# Patient Record
Sex: Female | Born: 1990 | Race: White | Hispanic: Yes | Smoking: Never smoker
Health system: Southern US, Community
[De-identification: ages and names within clinical notes are randomized; demographics above are authoritative.]

## PROBLEM LIST (undated history)

## (undated) ENCOUNTER — Inpatient Hospital Stay (HOSPITAL_COMMUNITY): Payer: Self-pay

## (undated) DIAGNOSIS — Z789 Other specified health status: Secondary | ICD-10-CM

## (undated) DIAGNOSIS — K8020 Calculus of gallbladder without cholecystitis without obstruction: Secondary | ICD-10-CM

## (undated) HISTORY — PX: NO PAST SURGERIES: SHX2092

## (undated) HISTORY — PX: OTHER SURGICAL HISTORY: SHX169

---
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
## 2012-01-25 ENCOUNTER — Encounter (HOSPITAL_COMMUNITY): Payer: Self-pay | Admitting: *Deleted

## 2012-01-25 ENCOUNTER — Inpatient Hospital Stay (HOSPITAL_COMMUNITY)
Admission: AD | Admit: 2012-01-25 | Discharge: 2012-01-25 | Disposition: A | Discharge status: Home / Self Care | Payer: Medicaid Other | Source: Ambulatory Visit | Attending: Obstetrics | Admitting: Obstetrics

## 2012-01-25 DIAGNOSIS — R109 Unspecified abdominal pain: Secondary | ICD-10-CM | POA: Insufficient documentation

## 2012-01-25 DIAGNOSIS — K5900 Constipation, unspecified: Secondary | ICD-10-CM | POA: Insufficient documentation

## 2012-01-25 LAB — WET PREP, GENITAL
Clue Cells Wet Prep HPF POC: NONE SEEN
Trich, Wet Prep: NONE SEEN

## 2012-01-25 LAB — URINALYSIS, ROUTINE W REFLEX MICROSCOPIC
Bilirubin Urine: NEGATIVE
Protein, ur: NEGATIVE mg/dL
Urobilinogen, UA: 0.2 mg/dL (ref 0.0–1.0)

## 2012-01-25 LAB — URINE MICROSCOPIC-ADD ON

## 2012-01-25 MED ORDER — CALCIUM POLYCARBOPHIL 625 MG PO TABS: 625.0000 mg | ORAL_TABLET | Freq: Every day | ORAL | Status: DC

## 2012-01-25 MED ORDER — POLYETHYLENE GLYCOL 3350 17 G PO PACK: 17.0000 g | PACK | Freq: Every day | ORAL | Status: AC | PRN

## 2012-01-25 NOTE — Discharge Instructions (Signed)
Constipacin en los adultos  (Constipation in Adults)  Se denomina constipacin al hecho de mover el intestino menos de dos veces por semana. Generalmente las heces son duras. A medida que envejecemos, la constipacin es ms frecuente. Si trata de solucionarlo con laxantes, puede empeorar el problema. Los laxantes utilizados durante largos perodos pueden debilitar los msculos del colon. Esto har que la constipacin empeore gradualmente. Una dieta pobre en fibras, la ingesta insuficiente de lquidos y algunos medicamentos pueden empeorar el problema.  ALGUNOS MEDICAMENTOS QUE PUEDEN CAUSAR CONSTIPACIN SON:   Diurticos.   Boqueadotes de los canales de calcio (medicamento utilizado para controlar la presin arterial y el funcionamiento cardaco.   Narcticos (ciertos medicamentos para el dolor).   Anticolinrgicos.   Antiinflamatorios.   Anticidos que contengan aluminio.  ALGUNOS MEDICAMENTOS QUE PUEDEN CAUSAR CONSTIPACIN SON:    Diabetes.   Enfermedad de Parkinson.   Demencia (la mente no funciona adecuadamente y existen trastornos del pensamiento).   Ictus.   Depresin.   Otras enfermedades que causan dificultades con el metabolismo de sales y lquidos.  INSTRUCCIONES PARA EL CUIDADO DOMICILIARIO   El mejor tratamiento para la constipacin es el que se realiza sin tomar medicamentos. Es importante consumir ms fibras, frutas y vegetales.   Aumente lentamente el consumo de fibras a 25 a 38 gramos por da. Granos integrales, frutas, verduras y legumbres son buenas fuentes de fibra. Un dietista podr ayudarlo a incorporar alimentos altos en fibra en su dieta.   Beba gran cantidad de lquido para mantener la orina de tono claro o color amarillo plido.   Deber aadir un suplemento de fibra en su dieta si no puede recibir la cantidad suficiente a partir de los alimentos.   Aumentar la actividad fsica tambin ayuda a mejorar la regularidad.   Los supositorios, segn lo haya indicado el mdico,  podrn ser de utilidad. Si toma anticidos u otros productos que contengan aluminio o calcio, los que pueden causar constipacin, ser de gran ayuda cambiarlos por productos que contengan magnesio, con la aprobacin del mdico.   Si en el da de hoy le han aplicado un enema, considere que esta slo es una medida temporaria y no debe considerarse como tratamiento para la constipacin de larga data (crnica). Si utiliza enemas durante mucho tiempo, se debilitarn los msculos del colon y la constipacin empeorar.   Tambin deben evitarse en lo posible medidas ms enrgicas, como el consumo de sulfato de magnesio, siempre que sea posible. El sulfato de magnesio puede causar diarrea incontrolable. Sin embargo, si usted es una persona de edad avanzada, estas medidas pueden ser tentadoras. En algunos casos, este tipo de medidas ni siquiera le dan tiempo para llegar al bao.  SOLICITE ATENCIN MDICA DE INMEDIATO SI:   Observa sangre de color rojo brillante en las heces.   El estreimiento persiste durante ms de cuatro das.   Presenta dolor abdominal o rectal junto con el estreimiento.   No parece sentirse mejor.   Tiene preguntas para formular, o alguna preocupacin, o no mejora.  EST SEGURO QUE:    Comprende las instrucciones para el alta mdica.   Controlar su enfermedad.   Solicitar atencin mdica de inmediato segn las indicaciones.  Document Released: 08/09/2007 Document Revised: 07/09/2011  [employer] Patient Information 2012 [employer].

## 2012-01-25 NOTE — MAU Note (Signed)
Pt states, " I had my baby Feb. 14,th and I missed my six week appointment. The office told me to come here to checked. I sometimes have pain in my abdomen, but none today."

## 2012-01-25 NOTE — MAU Provider Note (Signed)
History     CSN: [id number]  Arrival date and time: 01/25/12 7829   First Provider Initiated Contact with Patient 01/25/12 2126      Chief Complaint  Patient presents with  . Abdominal Cramping   HPI This is a 21 y.o. female who presents with c/o diffuse, irregular abdominal pain since February when she had a baby.  Has tried Advil 400mg  one time only. Has a long history of constipation (goes every 2 weeks).  Never went back for her 6 week PP visit. Has not gotten any contraception and does not want to be pregnant.   RN NOTE: Pt states, " I had my baby Feb. 14,th and I missed my six week appointment. The office told me to come here to checked. I sometimes have pain in my abdomen, but none today."       OB History    Grav Para Term Preterm Abortions TAB SAB Ect Mult Living   1 1 1  0 0 0 0 0 0 1      Past Medical History  Diagnosis Date  . No pertinent past medical history     Past Surgical History  Procedure Date  . No past surgeries     Family History  Problem Relation Age of Onset  . Heart disease Mother   . Diabetes Mother   . Hyperlipidemia Mother   . Anesthesia problems Neg Hx   . Hypotension Neg Hx   . Malignant hyperthermia Neg Hx   . Pseudochol deficiency Neg Hx   . Mental retardation Father     downs syndrome    History  Substance Use Topics  . Smoking status: Never Smoker   . Smokeless tobacco: Never Used  . Alcohol Use: No    Allergies: No Known Allergies  Prescriptions prior to admission  Medication Sig Dispense Refill  . ibuprofen (ADVIL,MOTRIN) 600 MG tablet Take 1 tablet (600 mg total) by mouth every 6 (six) hours as needed for pain (pain scale < 4).  30 tablet  5    Review of Systems  Constitutional: Negative for fever and chills.  Gastrointestinal: Positive for abdominal pain and constipation. Negative for nausea, vomiting and diarrhea.   Physical Exam   Blood pressure 124/71, pulse 74, temperature 99 F (37.2 C), temperature  source Oral, resp. rate 16, height 5' 3.75" (1.619 m), weight 208 lb 9 oz (94.603 kg), last menstrual period 01/21/2012, not currently breastfeeding.  Physical Exam  Constitutional: She is oriented to person, place, and time. She appears well-developed and well-nourished. No distress.  HENT:  Head: Normocephalic.  Cardiovascular: Normal rate.   Respiratory: Effort normal.  GI: Soft. Bowel sounds are normal. She exhibits no distension and no mass. There is no tenderness. There is no rebound and no guarding.  Genitourinary: No vaginal discharge found.  Musculoskeletal: Normal range of motion.  Neurological: She is alert and oriented to person, place, and time.  Skin: Skin is warm and dry.  Psychiatric: She has a normal mood and affect.   CBC    Component Value Date/Time   WBC 11.6* 09/18/2011 0605   RBC 2.71* 09/18/2011 0605   HGB 6.7* 09/18/2011 0605   HCT 21.5* 09/18/2011 0605   PLT 337 09/18/2011 0605   MCV 79.3 09/18/2011 0605   MCH 24.7* 09/18/2011 0605   MCHC 31.2 09/18/2011 0605   RDW 17.5* 09/18/2011 5621     MAU Course  Procedures  MDM Discussed reason for PP exam with pt and husband. Discussed  constipation and its association with colicky pain. Discussed fiber therapy for prevention and Miralax for treatment. Discussed anemia she had on discharge in February and need to continue iron/vitamins until rechecked. Discussed need to find a family doctor (husband has one).    Assessment and Plan  A:  Constipation       4 months Postpartum P:  Discharge home       Use Miralax for constipation and daily fiber for prevention       Good hydration       Discussed need for contraception if she does not want to get pregnant, can go to Dr Gaynell Face or [facility] (only had Emergency Medicaid).  [name] 01/25/2012, 10:05 PM

## 2012-01-25 NOTE — MAU Note (Signed)
Pt delivered vaginally on 09/16/10 and did not have a 6 week postpartum check-up; she is c/o period cramps all the time since her delivery;

## 2012-01-26 LAB — GC/CHLAMYDIA PROBE AMP, GENITAL
Chlamydia, DNA Probe: NEGATIVE
GC Probe Amp, Genital: NEGATIVE

## 2012-08-28 ENCOUNTER — Inpatient Hospital Stay (HOSPITAL_COMMUNITY)
Admission: AD | Admit: 2012-08-28 | Discharge: 2012-08-28 | Disposition: A | Discharge status: Home / Self Care | Payer: Self-pay | Source: Ambulatory Visit | Attending: Obstetrics & Gynecology | Admitting: Obstetrics & Gynecology

## 2012-08-28 ENCOUNTER — Encounter (HOSPITAL_COMMUNITY): Payer: Self-pay | Admitting: Family

## 2012-08-28 DIAGNOSIS — R109 Unspecified abdominal pain: Secondary | ICD-10-CM | POA: Insufficient documentation

## 2012-08-28 DIAGNOSIS — Z3202 Encounter for pregnancy test, result negative: Secondary | ICD-10-CM | POA: Insufficient documentation

## 2012-08-28 DIAGNOSIS — N926 Irregular menstruation, unspecified: Secondary | ICD-10-CM | POA: Insufficient documentation

## 2012-08-28 DIAGNOSIS — N912 Amenorrhea, unspecified: Secondary | ICD-10-CM | POA: Insufficient documentation

## 2012-08-28 LAB — URINALYSIS, ROUTINE W REFLEX MICROSCOPIC
Glucose, UA: NEGATIVE mg/dL
Hgb urine dipstick: NEGATIVE
Leukocytes, UA: NEGATIVE
Specific Gravity, Urine: 1.02 (ref 1.005–1.030)
Urobilinogen, UA: 0.2 mg/dL (ref 0.0–1.0)

## 2012-08-28 LAB — WET PREP, GENITAL: Yeast Wet Prep HPF POC: NONE SEEN

## 2012-08-28 LAB — POCT PREGNANCY, URINE: Preg Test, Ur: NEGATIVE

## 2012-08-28 NOTE — MAU Provider Note (Signed)
Attestation of Attending Supervision of Advanced Practitioner (CNM/NP): Evaluation and management procedures were performed by the Advanced Practitioner under my supervision and collaboration.  I have reviewed the Advanced Practitioner's note and chart, and I agree with the management and plan.  HARRAWAY-SMITH, Jessiah Steinhart 9:25 PM

## 2012-08-28 NOTE — MAU Note (Signed)
Pt presents to MAU with complaints of no period for 2-3 months. Says she is usually very regular every month. She has taken several pregnancy tests and all were negative. She denies pain.

## 2012-08-28 NOTE — MAU Provider Note (Signed)
History     CSN: [id number]  Arrival date and time: 08/28/12 4098   First Provider Initiated Contact with Patient 08/28/12 1927      Chief Complaint  Patient presents with  . Amenorrhea   HPI Victoria Orozco is 22 y.o. G1P1001 presents with no period since 12/22 with abdominal cramping.   Denies vaginal bleeding or discharge.  Rates the pain as 5/10.  Advil helps the discomfort.   She had a negative UPT at home and the one here is also Neg.  She is trying to conceive  Had regular cycles prior to conceiving the first time.  He will be 1 next month.   Has not been on any contraception.  She reports only 1 period since delivery.  She did not breast feed.  Patient has seen Dr. Gaynell Face in the past and prefers to return to his office for further evaluation.     Past Medical History  Diagnosis Date  . No pertinent past medical history     Past Surgical History  Procedure Date  . No past surgeries     Family History  Problem Relation Age of Onset  . Heart disease Mother   . Diabetes Mother   . Hyperlipidemia Mother   . Anesthesia problems Neg Hx   . Hypotension Neg Hx   . Malignant hyperthermia Neg Hx   . Pseudochol deficiency Neg Hx   . Mental retardation Father     downs syndrome    History  Substance Use Topics  . Smoking status: Never Smoker   . Smokeless tobacco: Never Used  . Alcohol Use: No    Allergies: No Known Allergies  Prescriptions prior to admission  Medication Sig Dispense Refill  . ibuprofen (ADVIL,MOTRIN) 600 MG tablet Take 1 tablet (600 mg total) by mouth every 6 (six) hours as needed for pain (pain scale < 4).  30 tablet  5  . polycarbophil (FIBERCON) 625 MG tablet Take 1 tablet (625 mg total) by mouth daily.  30 tablet  1    Review of Systems  Constitutional: Negative.   Respiratory: Negative.   Cardiovascular: Negative.   Gastrointestinal: Positive for abdominal pain (cramping).  Genitourinary: Negative for dysuria, urgency and  frequency.       Neg for vaginal bleeding or discharge.  Missed period  Neurological: Negative for headaches.   Physical Exam   Blood pressure 134/71, pulse 79, temperature 97.8 F (36.6 C), temperature source Oral, resp. rate 18, height 5\' 5"  (1.651 m), weight 218 lb (98.884 kg), last menstrual period 07/24/2012, not currently breastfeeding.  Physical Exam  Constitutional: She is oriented to person, place, and time. She appears well-developed and well-nourished. No distress.  HENT:  Head: Normocephalic.  Neck: Normal range of motion.  Cardiovascular: Normal rate.   Respiratory: Effort normal.  GI: Soft. She exhibits no distension and no mass. There is no tenderness. There is no rebound and no guarding.  Genitourinary: Uterus is not enlarged and not tender. Cervix exhibits no motion tenderness, no discharge and no friability. Right adnexum displays no mass, no tenderness and no fullness. Left adnexum displays no mass, no tenderness and no fullness. No erythema, tenderness or bleeding around the vagina. Vaginal discharge (small amount of malodorous discharge--white.  ) found.  Neurological: She is alert and oriented to person, place, and time.  Skin: Skin is warm and dry.  Psychiatric: She has a normal mood and affect. Her behavior is normal.   Results for orders placed during the hospital  encounter of 08/28/12 (from the past 24 hour(s))  URINALYSIS, ROUTINE W REFLEX MICROSCOPIC     Status: Normal   Collection Time   08/28/12  6:50 PM      Component Value Range   Color, Urine YELLOW  YELLOW   APPearance CLEAR  CLEAR   Specific Gravity, Urine 1.020  1.005 - 1.030   pH 6.0  5.0 - 8.0   Glucose, UA NEGATIVE  NEGATIVE mg/dL   Hgb urine dipstick NEGATIVE  NEGATIVE   Bilirubin Urine NEGATIVE  NEGATIVE   Ketones, ur NEGATIVE  NEGATIVE mg/dL   Protein, ur NEGATIVE  NEGATIVE mg/dL   Urobilinogen, UA 0.2  0.0 - 1.0 mg/dL   Nitrite NEGATIVE  NEGATIVE   Leukocytes, UA NEGATIVE  NEGATIVE    POCT PREGNANCY, URINE     Status: Normal   Collection Time   08/28/12  6:58 PM      Component Value Range   Preg Test, Ur NEGATIVE  NEGATIVE  WET PREP, GENITAL     Status: Abnormal   Collection Time   08/28/12  7:44 PM      Component Value Range   Yeast Wet Prep HPF POC NONE SEEN  NONE SEEN   Trich, Wet Prep NONE SEEN  NONE SEEN   Clue Cells Wet Prep HPF POC NONE SEEN  NONE SEEN   WBC, Wet Prep HPF POC FEW (*) NONE SEEN   MAU Course  Procedures  GC/CHL culture to lab  MDM  Assessment and Plan  A:  Irregular menses     Trying to conceive      Negative pregnancy test  P:  Patient wants to return to Dr. Gaynell Face for further evaluation of irregular cycles      She wants to conceive  KEY,EVE M 08/28/2012, 7:27 PM

## 2012-08-28 NOTE — MAU Note (Addendum)
Patient presents to MAU with c/o no period since 12/22. Reports intermittent abdominal cramping; reports she is not on [facility] and is trying to conceive. Negative HPT.

## 2012-09-11 ENCOUNTER — Emergency Department (HOSPITAL_COMMUNITY)
Admission: EM | Admit: 2012-09-11 | Discharge: 2012-09-12 | Disposition: A | Discharge status: Home / Self Care | Payer: Self-pay | Attending: Emergency Medicine | Admitting: Emergency Medicine

## 2012-09-11 ENCOUNTER — Encounter (HOSPITAL_COMMUNITY): Payer: Self-pay | Admitting: *Deleted

## 2012-09-11 DIAGNOSIS — D649 Anemia, unspecified: Secondary | ICD-10-CM | POA: Insufficient documentation

## 2012-09-11 DIAGNOSIS — R42 Dizziness and giddiness: Secondary | ICD-10-CM | POA: Insufficient documentation

## 2012-09-11 DIAGNOSIS — Z3202 Encounter for pregnancy test, result negative: Secondary | ICD-10-CM | POA: Insufficient documentation

## 2012-09-11 DIAGNOSIS — N920 Excessive and frequent menstruation with regular cycle: Secondary | ICD-10-CM | POA: Insufficient documentation

## 2012-09-11 NOTE — ED Notes (Signed)
Pt states her LMP started 3 weeks ago and she is still bleeding. Wants to get checked out

## 2012-09-12 LAB — URINALYSIS, ROUTINE W REFLEX MICROSCOPIC
Glucose, UA: NEGATIVE mg/dL
Protein, ur: NEGATIVE mg/dL
pH: 6 (ref 5.0–8.0)

## 2012-09-12 LAB — POCT I-STAT, CHEM 8
BUN: 13 mg/dL (ref 6–23)
Calcium, Ion: 1.26 mmol/L — ABNORMAL HIGH (ref 1.12–1.23)
Chloride: 107 mEq/L (ref 96–112)
Glucose, Bld: 98 mg/dL (ref 70–99)
TCO2: 26 mmol/L (ref 0–100)

## 2012-09-12 LAB — WET PREP, GENITAL: Yeast Wet Prep HPF POC: NONE SEEN

## 2012-09-12 LAB — URINE MICROSCOPIC-ADD ON

## 2012-09-12 LAB — POCT PREGNANCY, URINE: Preg Test, Ur: NEGATIVE

## 2012-09-12 MED ORDER — FERROUS SULFATE 325 (65 FE) MG PO TABS: 325.0000 mg | ORAL_TABLET | Freq: Every day | ORAL | Status: DC

## 2012-09-12 NOTE — ED Provider Notes (Signed)
History     CSN: [id number]  Arrival date & time 09/11/12  2129   First MD Initiated Contact with Patient 09/11/12 2340      Chief Complaint  Patient presents with  . Vaginal Bleeding    (Consider location/radiation/quality/duration/timing/severity/associated sxs/prior treatment) HPI Cerria Randhawa is a 22 y.o. female With a history of menorrhagia as well as anemia who presents with continued bleeding since her last menstrual period started 3 weeks ago with continued bleeding. She has minimal cramping, and is pain-free at this time. She specifically denies any nausea vomiting, diarrhea, abdominal pain, chest pain, dizziness or lightheadedness.  She says she is occasionally lightheaded at rest this lasts about 5 minutes, she denies any history of venous thromboembolism in her or family members, no recent long travel, no hemoptysis, no cancer treatment, no exogenous hormones.   Past Medical History  Diagnosis Date  . No pertinent past medical history     Past Surgical History  Procedure Laterality Date  . No past surgeries      Family History  Problem Relation Age of Onset  . Heart disease Mother   . Diabetes Mother   . Hyperlipidemia Mother   . Anesthesia problems Neg Hx   . Hypotension Neg Hx   . Malignant hyperthermia Neg Hx   . Pseudochol deficiency Neg Hx   . Mental retardation Father     downs syndrome    History  Substance Use Topics  . Smoking status: Never Smoker   . Smokeless tobacco: Never Used  . Alcohol Use: No    OB History   Grav Para Term Preterm Abortions TAB SAB Ect Mult Living   1 1 1  0 0 0 0 0 0 1      Review of Systems At least 10pt or greater review of systems completed and are negative except where specified in the HPI.  Allergies  Review of patient's allergies indicates no known allergies.  Home Medications  No current outpatient prescriptions on file.  BP 148/84  Pulse 82  Temp(Src) 98.4 F (36.9 C) (Oral)  SpO2 99%   LMP 07/24/2012  Physical Exam  Nursing notes reviewed.  Electronic medical record reviewed. VITAL SIGNS:   Filed Vitals:   09/11/12 2134  BP: 148/84  Pulse: 82  Temp: 98.4 F (36.9 C)  TempSrc: Oral  SpO2: 99%   CONSTITUTIONAL: Awake, oriented, appears non-toxic HENT: Atraumatic, normocephalic, oral mucosa pink and moist, airway patent. Nares patent without drainage. External ears normal. EYES: Conjunctiva clear, EOMI, PERRLA NECK: Trachea midline, non-tender, supple CARDIOVASCULAR: Normal heart rate, Normal rhythm, No murmurs, rubs, gallops PULMONARY/CHEST: Clear to auscultation, no rhonchi, wheezes, or rales. Symmetrical breath sounds. Non-tender. ABDOMINAL: Non-distended, soft, non-tender - no rebound or guarding.  BS normal. NEUROLOGIC: Non-focal, moving all four extremities, no gross sensory or motor deficits. EXTREMITIES: No clubbing, cyanosis, or edema SKIN: Warm, Dry, No erythema, No rash PELVIC EXAM: normal external genitalia, vulva, dark blood in the vaginal vault, cervix appears normal without cervical motion tenderness, uterus and adnexa are difficult to palpate secondary to patient body habitus ED Course  Procedures (including critical care time)  Labs Reviewed  WET PREP, GENITAL - Abnormal; Notable for the following:    WBC, Wet Prep HPF POC FEW (*)    All other components within normal limits  URINALYSIS, ROUTINE W REFLEX MICROSCOPIC - Abnormal; Notable for the following:    APPearance CLOUDY (*)    Hgb urine dipstick LARGE (*)    All other components  within normal limits  POCT I-STAT, CHEM 8 - Abnormal; Notable for the following:    Calcium, Ion 1.26 (*)    Hemoglobin 11.6 (*)    HCT 34.0 (*)    All other components within normal limits  GC/CHLAMYDIA PROBE AMP  URINE MICROSCOPIC-ADD ON  POCT PREGNANCY, URINE   No results found.   1. Menorrhagia   2. Anemia       MDM  Girtha Kilgore is a 22 y.o. female Presents with occasional shortness  of breath and concerns over prolonged vaginal bleeding, patient is not pregnant, she is mildly anemic with a hemoglobin of 11.6 and is currently not taking any iron supplements. She is currently pain-free. I do not think her shortness of breath is related to ACS, this is a young otherwise healthy female although she is obese, in addition she has a pertinent negative score, do not think this intermittent shortness of breath is caused by a blood clot.  Based on the transient nature, I also don't think it's related to her borderline anemia. I do think it is worth starting the patient on iron supplements given the patient's history of severe anemia and menorrhagia. We'll refer the patient to [facility] or the [facility] and give her a resource guide for primary care physician as she will likely benefit from oral contraceptives to regulate her menstrual cycle.  I explained the diagnosis and have given explicit precautions to return to the ER including Dizziness, chest pain, shortness of breath that is constant, severe bleeding or any other new or worsening symptoms. The patient understands and accepts the medical plan as it's been dictated and I have answered their questions. Discharge instructions concerning home care and prescriptions have been given.  The patient is STABLE and is discharged to home in good condition.         Jones Skene, MD 09/12/12 1610

## 2012-09-13 LAB — GC/CHLAMYDIA PROBE AMP
CT Probe RNA: NEGATIVE
GC Probe RNA: NEGATIVE

## 2012-12-31 ENCOUNTER — Encounter (HOSPITAL_COMMUNITY): Payer: Self-pay | Admitting: Obstetrics and Gynecology

## 2012-12-31 ENCOUNTER — Inpatient Hospital Stay (HOSPITAL_COMMUNITY)
Admission: AD | Admit: 2012-12-31 | Discharge: 2012-12-31 | Disposition: A | Discharge status: Home / Self Care | Payer: Self-pay | Source: Ambulatory Visit | Attending: Obstetrics & Gynecology | Admitting: Obstetrics & Gynecology

## 2012-12-31 DIAGNOSIS — Z3202 Encounter for pregnancy test, result negative: Secondary | ICD-10-CM | POA: Insufficient documentation

## 2012-12-31 DIAGNOSIS — Z3201 Encounter for pregnancy test, result positive: Secondary | ICD-10-CM

## 2012-12-31 DIAGNOSIS — N912 Amenorrhea, unspecified: Secondary | ICD-10-CM | POA: Insufficient documentation

## 2012-12-31 MED ORDER — PRENATAL PLUS 27-1 MG PO TABS: 1.0000 | ORAL_TABLET | Freq: Every day | ORAL | Status: DC

## 2012-12-31 NOTE — MAU Note (Signed)
Pt has not had a period and took 3 pregnancy test and they where all positive wants to make sure she is pregnant.

## 2012-12-31 NOTE — MAU Provider Note (Signed)
CC: Possible Pregnancy        Subjective HPI Victoria Orozco 22 y.o. G1P1001 [redacted]w[redacted]d by LMP presents for verification of pregnancy. HPT positive. Denies vaginal bleeding, abdominal pain and nausea/vomiting. Menses were irregular and she got a pill from [facility] in [place] and when finished the course she had regular menses x2. No other concerns.   Significant PMH: None.  Significant OB history: None Nonsmoker  Objective Blood pressure 130/74, pulse 69, temperature 98.4 F (36.9 C), temperature source Oral, resp. rate 18, height 5\' 4"  (1.626 m), weight 101.969 kg (224 lb 12.8 oz), last menstrual period 11/25/2012.  Physical Exam General: WN, WD female in no apparent distress Abdomen: soft, nontender, without organomegaly  Lab Results Results for orders placed during the hospital encounter of 12/31/12 (from the past 24 hour(s))  POCT PREGNANCY, URINE     Status: None   Collection Time    12/31/12  4:10 PM      Result Value Range   Preg Test, Ur NEGATIVE  NEGATIVE    Assessment 21 y.o. G1P1001 amenorrhea x 1 wk with negative pregnancy test.  Plan Reassured. Keep menstrual calendar. Do HPT in 1 wk if still amenorrheic.    Medication List    TAKE these medications       ferrous sulfate 325 (65 FE) MG tablet  Take 1 tablet (325 mg total) by mouth daily.     prenatal vitamin w/FE, FA 27-1 MG Tabs  Take 1 tablet by mouth daily.       Follow-up Information   Follow up with [facility] [place]. (See list of providers below)    Contact information:   [address] [place] [address] [phone]       Danae Orleans, CNM 12/31/2012 4:02 PM

## 2012-12-31 NOTE — MAU Provider Note (Signed)
Attestation of Attending Supervision of Advanced Practitioner (CNM/NP): Evaluation and management procedures were performed by the Advanced Practitioner under my supervision and collaboration.  I have reviewed the Advanced Practitioner's note and chart, and I agree with the management and plan.  HARRAWAY-SMITH, Bulah Lurie 6:47 PM     

## 2012-12-31 NOTE — ED Notes (Signed)
Pregnancy test negative. D.Poe, CNM discussed results with pt and told her to retest in one week.

## 2013-05-01 ENCOUNTER — Emergency Department (HOSPITAL_COMMUNITY)
Admission: EM | Admit: 2013-05-01 | Discharge: 2013-05-02 | Disposition: A | Discharge status: Home / Self Care | Payer: Self-pay | Attending: Emergency Medicine | Admitting: Emergency Medicine

## 2013-05-01 ENCOUNTER — Encounter (HOSPITAL_COMMUNITY): Payer: Self-pay | Admitting: *Deleted

## 2013-05-01 DIAGNOSIS — N898 Other specified noninflammatory disorders of vagina: Secondary | ICD-10-CM | POA: Insufficient documentation

## 2013-05-01 DIAGNOSIS — Z3202 Encounter for pregnancy test, result negative: Secondary | ICD-10-CM | POA: Insufficient documentation

## 2013-05-01 DIAGNOSIS — N939 Abnormal uterine and vaginal bleeding, unspecified: Secondary | ICD-10-CM

## 2013-05-01 LAB — URINE MICROSCOPIC-ADD ON

## 2013-05-01 LAB — URINALYSIS, ROUTINE W REFLEX MICROSCOPIC
Bilirubin Urine: NEGATIVE
Glucose, UA: NEGATIVE mg/dL
Ketones, ur: NEGATIVE mg/dL
Nitrite: NEGATIVE
Specific Gravity, Urine: 1.021 (ref 1.005–1.030)
pH: 5.5 (ref 5.0–8.0)

## 2013-05-01 NOTE — ED Notes (Signed)
Pt states vaginal bleeding for 2 weeks at a time. Pt states that she the bleeding will stop then if she lifts something heavy such as her 22y/o she will start to bleed again. Pt states that she is changing pads much like her period. Pt states painful intercourse as well.

## 2013-05-02 LAB — WET PREP, GENITAL
Clue Cells Wet Prep HPF POC: NONE SEEN
Trich, Wet Prep: NONE SEEN
Yeast Wet Prep HPF POC: NONE SEEN

## 2013-05-02 LAB — POCT I-STAT, CHEM 8
Calcium, Ion: 1.27 mmol/L — ABNORMAL HIGH (ref 1.12–1.23)
Creatinine, Ser: 0.7 mg/dL (ref 0.50–1.10)
Glucose, Bld: 91 mg/dL (ref 70–99)
Hemoglobin: 12.2 g/dL (ref 12.0–15.0)
Potassium: 4 mEq/L (ref 3.5–5.1)

## 2013-05-02 LAB — CBC
HCT: 35.5 % — ABNORMAL LOW (ref 36.0–46.0)
Hemoglobin: 12.4 g/dL (ref 12.0–15.0)
MCHC: 34.9 g/dL (ref 30.0–36.0)
MCV: 84.5 fL (ref 78.0–100.0)

## 2013-05-02 MED ORDER — CEFTRIAXONE SODIUM 250 MG IJ SOLR
250.0000 mg | Freq: Once | INTRAMUSCULAR | Status: AC
  Administered 2013-05-02: 250 mg via INTRAMUSCULAR
  Filled 2013-05-02: qty 250

## 2013-05-02 MED ORDER — AZITHROMYCIN 1 G PO PACK
1.0000 g | PACK | Freq: Once | ORAL | Status: AC
  Administered 2013-05-02: 1 g via ORAL
  Filled 2013-05-02: qty 1

## 2013-05-02 MED ORDER — LIDOCAINE HCL (PF) 1 % IJ SOLN
INTRAMUSCULAR | Status: AC
  Filled 2013-05-02: qty 5

## 2013-05-02 NOTE — ED Provider Notes (Signed)
CSN: [id number]     Arrival date & time 05/01/13  1944 History   First MD Initiated Contact with Patient 05/01/13 2339     Chief Complaint  Patient presents with  . Vaginal Bleeding   (Consider location/radiation/quality/duration/timing/severity/associated sxs/prior Treatment) HPI Hx per PT  - LNMP 03/05/13.  She has had ongoing vaginal bleeding and some intermittent cramping, describes heavy bleeding. No back pain. No dysuria/ urgency/ frequency. No vag discharge.  Husband bedside. She denies h/o same. Symptoms MOD in severity.   Past Medical History  Diagnosis Date  . No pertinent past medical history    Past Surgical History  Procedure Laterality Date  . No past surgeries     Family History  Problem Relation Age of Onset  . Heart disease Mother   . Diabetes Mother   . Hyperlipidemia Mother   . Anesthesia problems Neg Hx   . Hypotension Neg Hx   . Malignant hyperthermia Neg Hx   . Pseudochol deficiency Neg Hx   . Mental retardation Father     downs syndrome   History  Substance Use Topics  . Smoking status: Never Smoker   . Smokeless tobacco: Never Used  . Alcohol Use: No   OB History   Grav Para Term Preterm Abortions TAB SAB Ect Mult Living   1 1 1  0 0 0 0 0 0 1     Review of Systems  Constitutional: Negative for fever and chills.  HENT: Negative for neck pain and neck stiffness.   Eyes: Negative for visual disturbance.  Respiratory: Negative for shortness of breath.   Cardiovascular: Negative for chest pain.  Gastrointestinal: Negative for abdominal pain.  Genitourinary: Positive for vaginal bleeding. Negative for dysuria.  Musculoskeletal: Negative for back pain.  Skin: Negative for rash.  Neurological: Negative for headaches.  All other systems reviewed and are negative.    Allergies  Review of patient's allergies indicates no known allergies.  Home Medications  No current outpatient prescriptions on file. BP 140/98  Pulse 69  Temp(Src) 98.1 F  (36.7 C) (Oral)  Resp 16  Ht 5\' 6"  (1.676 m)  Wt 227 lb 1 oz (102.995 kg)  BMI 36.67 kg/m2  SpO2 98% Physical Exam  Constitutional: She is oriented to person, place, and time. She appears well-developed and well-nourished.  HENT:  Head: Normocephalic and atraumatic.  Eyes: Conjunctivae and EOM are normal. Pupils are equal, round, and reactive to light.  Neck: Neck supple.  Cardiovascular: Normal rate, regular rhythm and intact distal pulses.   Pulmonary/Chest: Effort normal and breath sounds normal. No respiratory distress.  Abdominal: Soft. She exhibits no distension. There is no tenderness. There is no rebound and no guarding.  No CVAT  Genitourinary:  External GU within normal limits, vaginal full with minimal amount of dark blood, no cervical motion tenderness. No adnexal tenderness.  Musculoskeletal: Normal range of motion. She exhibits no edema.  Neurological: She is alert and oriented to person, place, and time.  Skin: Skin is warm and dry.    ED Course  Procedures (including critical care time) Labs Review Labs Reviewed  WET PREP, GENITAL - Abnormal; Notable for the following:    WBC, Wet Prep HPF POC MODERATE (*)    All other components within normal limits  URINALYSIS, ROUTINE W REFLEX MICROSCOPIC - Abnormal; Notable for the following:    APPearance CLOUDY (*)    Hgb urine dipstick LARGE (*)    Leukocytes, UA SMALL (*)    All other components  within normal limits  URINE MICROSCOPIC-ADD ON - Abnormal; Notable for the following:    Squamous Epithelial / LPF MANY (*)    All other components within normal limits  CBC - Abnormal; Notable for the following:    HCT 35.5 (*)    All other components within normal limits  POCT I-STAT, CHEM 8 - Abnormal; Notable for the following:    Calcium, Ion 1.27 (*)    All other components within normal limits  GC/CHLAMYDIA PROBE AMP  POCT PREGNANCY, URINE   No pain in the emergency department.   Plan discharge home with  followup women's outpatient clinics - provided. Patient agrees to take Motrin as needed for any intermittent cramping. Strict return precautions verbalized as understood.  Serial abdominal exams benign  MDM  Diagnosis: Abnormal vaginal bleeding  Labs, UA/UPT VS and nurses notes reviewed   Sunnie Nielsen, MD 05/02/13 [phone]

## 2013-05-02 NOTE — ED Notes (Signed)
Pelvic cart set up and at bedside. 

## 2013-05-03 LAB — GC/CHLAMYDIA PROBE AMP: GC Probe RNA: NEGATIVE

## 2013-09-11 ENCOUNTER — Inpatient Hospital Stay (HOSPITAL_COMMUNITY): Payer: Self-pay

## 2013-09-11 ENCOUNTER — Encounter (HOSPITAL_COMMUNITY): Payer: Self-pay | Admitting: *Deleted

## 2013-09-11 ENCOUNTER — Inpatient Hospital Stay (HOSPITAL_COMMUNITY)
Admission: AD | Admit: 2013-09-11 | Discharge: 2013-09-11 | Disposition: A | Discharge status: Home / Self Care | Payer: Self-pay | Source: Ambulatory Visit | Attending: Obstetrics and Gynecology | Admitting: Obstetrics and Gynecology

## 2013-09-11 DIAGNOSIS — R109 Unspecified abdominal pain: Secondary | ICD-10-CM | POA: Insufficient documentation

## 2013-09-11 DIAGNOSIS — O26851 Spotting complicating pregnancy, first trimester: Secondary | ICD-10-CM

## 2013-09-11 DIAGNOSIS — O26859 Spotting complicating pregnancy, unspecified trimester: Secondary | ICD-10-CM | POA: Insufficient documentation

## 2013-09-11 LAB — URINALYSIS, ROUTINE W REFLEX MICROSCOPIC
Bilirubin Urine: NEGATIVE
GLUCOSE, UA: NEGATIVE mg/dL
Hgb urine dipstick: NEGATIVE
Ketones, ur: NEGATIVE mg/dL
LEUKOCYTES UA: NEGATIVE
NITRITE: NEGATIVE
PROTEIN: NEGATIVE mg/dL
Specific Gravity, Urine: 1.015 (ref 1.005–1.030)
Urobilinogen, UA: 0.2 mg/dL (ref 0.0–1.0)
pH: 6 (ref 5.0–8.0)

## 2013-09-11 LAB — CBC
HCT: 32.3 % — ABNORMAL LOW (ref 36.0–46.0)
HEMOGLOBIN: 11 g/dL — AB (ref 12.0–15.0)
MCH: 28.1 pg (ref 26.0–34.0)
MCHC: 34.1 g/dL (ref 30.0–36.0)
MCV: 82.4 fL (ref 78.0–100.0)
PLATELETS: 302 10*3/uL (ref 150–400)
RBC: 3.92 MIL/uL (ref 3.87–5.11)
RDW: 15.5 % (ref 11.5–15.5)
WBC: 7.9 10*3/uL (ref 4.0–10.5)

## 2013-09-11 LAB — WET PREP, GENITAL
Clue Cells Wet Prep HPF POC: NONE SEEN
Trich, Wet Prep: NONE SEEN
YEAST WET PREP: NONE SEEN

## 2013-09-11 LAB — HCG, QUANTITATIVE, PREGNANCY: hCG, Beta Chain, Quant, S: 80258 m[IU]/mL — ABNORMAL HIGH (ref <5)

## 2013-09-11 LAB — POCT PREGNANCY, URINE: PREG TEST UR: POSITIVE — AB

## 2013-09-11 NOTE — MAU Note (Signed)
Pt unsure of LMP, +UPT at home 5 wks ago.  Pt having small amt of pinkish bleeding today with cramping.

## 2013-09-11 NOTE — MAU Provider Note (Signed)
History     CSN: [id number]  Arrival date and time: 09/11/13 [id number]   First Provider Initiated Contact with Patient 09/11/13 2004      Chief Complaint  Patient presents with  . Possible Pregnancy  . Vaginal Bleeding  . Abdominal Cramping   Possible Pregnancy  Vaginal Bleeding  Abdominal Cramping    Victoria Orozco is  23 y.o. G2P1001 at Unknown who presents today with with cramping and spotting. She states that she started having cramps this morning, and rates her pain 3/10. She has not taken anything for the pain. She also has been spotting since this morning as well. She states that she has had pink spotting only seen with wiping all day. She is planning on going [place] for [place].   Past Medical History  Diagnosis Date  . No pertinent past medical history     Past Surgical History  Procedure Laterality Date  . No past surgeries      Family History  Problem Relation Age of Onset  . Heart disease Mother   . Diabetes Mother   . Hyperlipidemia Mother   . Anesthesia problems Neg Hx   . Hypotension Neg Hx   . Malignant hyperthermia Neg Hx   . Pseudochol deficiency Neg Hx   . Mental retardation Father     downs syndrome    History  Substance Use Topics  . Smoking status: Never Smoker   . Smokeless tobacco: Never Used  . Alcohol Use: No    Allergies: No Known Allergies  No prescriptions prior to admission    Review of Systems  Genitourinary: Positive for vaginal bleeding.   Physical Exam   Blood pressure 135/75, pulse 68, temperature 98.2 F (36.8 C), temperature source Oral, resp. rate 16, height 5\' 6"  (1.676 m), weight 102.694 kg (226 lb 6.4 oz).  Physical Exam  Nursing note and vitals reviewed. Constitutional: She is oriented to person, place, and time. She appears well-developed and well-nourished. No distress.  Cardiovascular: Normal rate.   Respiratory: Effort normal.  GI: Soft. There is no tenderness.  Genitourinary:    External: no lesion Vagina: small amount of white discharge Cervix: pink, smooth, no CMT Uterus: slightly enlarged  Adnexa: NT   Neurological: She is alert and oriented to person, place, and time.  Skin: Skin is warm and dry.  Psychiatric: She has a normal mood and affect.    MAU Course  Procedures  Results for orders placed during the hospital encounter of 09/11/13 (from the past 24 hour(s))  URINALYSIS, ROUTINE W REFLEX MICROSCOPIC     Status: Abnormal   Collection Time    09/11/13  7:35 PM      Result Value Range   Color, Urine STRAW (*) YELLOW   APPearance CLEAR  CLEAR   Specific Gravity, Urine 1.015  1.005 - 1.030   pH 6.0  5.0 - 8.0   Glucose, UA NEGATIVE  NEGATIVE mg/dL   Hgb urine dipstick NEGATIVE  NEGATIVE   Bilirubin Urine NEGATIVE  NEGATIVE   Ketones, ur NEGATIVE  NEGATIVE mg/dL   Protein, ur NEGATIVE  NEGATIVE mg/dL   Urobilinogen, UA 0.2  0.0 - 1.0 mg/dL   Nitrite NEGATIVE  NEGATIVE   Leukocytes, UA NEGATIVE  NEGATIVE  POCT PREGNANCY, URINE     Status: Abnormal   Collection Time    09/11/13  7:42 PM      Result Value Range   Preg Test, Ur POSITIVE (*) NEGATIVE  WET PREP,  GENITAL     Status: Abnormal   Collection Time    09/11/13  8:10 PM      Result Value Range   Yeast Wet Prep HPF POC NONE SEEN  NONE SEEN   Trich, Wet Prep NONE SEEN  NONE SEEN   Clue Cells Wet Prep HPF POC NONE SEEN  NONE SEEN   WBC, Wet Prep HPF POC MODERATE (*) NONE SEEN  CBC     Status: Abnormal   Collection Time    09/11/13  8:25 PM      Result Value Range   WBC 7.9  4.0 - 10.5 K/uL   RBC 3.92  3.87 - 5.11 MIL/uL   Hemoglobin 11.0 (*) 12.0 - 15.0 g/dL   HCT 11.9 (*) 14.7 - 82.9 %   MCV 82.4  78.0 - 100.0 fL   MCH 28.1  26.0 - 34.0 pg   MCHC 34.1  30.0 - 36.0 g/dL   RDW 56.2  13.0 - 86.5 %   Platelets 302  150 - 400 K/uL  HCG, QUANTITATIVE, PREGNANCY     Status: Abnormal   Collection Time    09/11/13  8:25 PM      Result Value Range   hCG, Beta Chain, Quant, S [id number]  (*) <5 mIU/mL    US Ob Comp Less 14 Wks  09/11/2013   CLINICAL DATA:  Pregnant, bleeding  EXAM: OBSTETRIC <14 WK ULTRASOUND  TECHNIQUE: Transabdominal ultrasound was performed for evaluation of the gestation as well as the maternal uterus and adnexal regions.  COMPARISON:  None.  FINDINGS: Intrauterine gestational sac: Visualized/normal in shape.  Single.  Yolk sac:  Present  Embryo:  Present  Cardiac Activity: Present  Heart Rate: 169 bpm  CRL: 17.3 mm 8 w 2 d [place] EDC: April 21, 2014  Maternal uterus/adnexae: The right ovary is normal. There is a corpus luteal cyst measuring 1.8 x 1.7 x 1.7 cm in the left ovary. There is no free fluid. There is no subchorionic hemorrhage.  IMPRESSION: Single live intrauterine gestation measuring 8 weeks and 2 days without complications.   Electronically Signed   By: Sherian Rein M.D.   On: 09/11/2013 21:12    Assessment and Plan   1. Spotting complicating pregnancy in first trimester    Bleeding precautions First trimester precautions reviewed Return to MAU as needed  Start [facility] as soon as possible.   Tawnya Crook 09/11/2013, 9:18 PM

## 2013-09-11 NOTE — Discharge Instructions (Signed)
Vaginal Bleeding During Pregnancy, First Trimester °A small amount of bleeding (spotting) from the vagina is relatively common in early pregnancy. It usually stops on its own. Various things may cause bleeding or spotting in early pregnancy. Some bleeding may be related to the pregnancy, and some may not. In most cases, the bleeding is normal and is not a problem. However, bleeding can also be a sign of something serious. Be sure to tell your health care provider about any vaginal bleeding right away. °Some possible causes of vaginal bleeding during the first trimester include: °· Infection or inflammation of the cervix. °· Growths (polyps) on the cervix. °· Miscarriage or threatened miscarriage. °· Pregnancy tissue has developed outside of the uterus and in a fallopian tube (tubal pregnancy). °· Tiny cysts have developed in the uterus instead of pregnancy tissue (molar pregnancy). °HOME CARE INSTRUCTIONS  °Watch your condition for any changes. The following actions may help to lessen any discomfort you are feeling: °· Follow your health care provider's instructions for limiting your activity. If your health care provider orders bed rest, you may need to stay in bed and only get up to use the bathroom. However, your health care provider may allow you to continue light activity. °· If needed, make plans for someone to help with your regular activities and responsibilities while you are on bed rest. °· Keep track of the number of pads you use each day, how often you change pads, and how soaked (saturated) they are. Write this down. °· Do not use tampons. Do not douche. °· Do not have sexual intercourse or orgasms until approved by your health care provider. °· If you pass any tissue from your vagina, save the tissue so you can show it to your health care provider. °· Only take over-the-counter or prescription medicines as directed by your health care provider. °· Do not take aspirin because it can make you  bleed. °· Keep all follow-up appointments as directed by your health care provider. °SEEK MEDICAL CARE IF: °· You have any vaginal bleeding during any part of your pregnancy. °· You have cramps or labor pains. °SEEK IMMEDIATE MEDICAL CARE IF:  °· You have severe cramps in your back or belly (abdomen). °· You have a fever, not controlled by medicine. °· You pass large clots or tissue from your vagina. °· Your bleeding increases. °· You feel lightheaded or weak, or you have fainting episodes. °· You have chills. °· You are leaking fluid or have a gush of fluid from your vagina. °· You pass out while having a bowel movement. °MAKE SURE YOU: °· Understand these instructions. °· Will watch your condition. °· Will get help right away if you are not doing well or get worse. °Document Released: 04/29/2005 Document Revised: 05/10/2013 Document Reviewed: 03/27/2013 °ExitCare® Patient Information ©2014 [employer]. ° °

## 2013-09-12 LAB — GC/CHLAMYDIA PROBE AMP
CT Probe RNA: NEGATIVE
GC PROBE AMP APTIMA: NEGATIVE

## 2013-09-12 NOTE — MAU Provider Note (Signed)
Attestation of Attending Supervision of Advanced Practitioner (CNM/NP): Evaluation and management procedures were performed by the Advanced Practitioner under my supervision and collaboration.  I have reviewed the Advanced Practitioner's note and chart, and I agree with the management and plan.  Denson Niccoli 09/12/2013 7:34 AM

## 2013-10-14 ENCOUNTER — Encounter (HOSPITAL_COMMUNITY): Payer: Self-pay

## 2013-10-14 ENCOUNTER — Inpatient Hospital Stay (HOSPITAL_COMMUNITY)
Admission: AD | Admit: 2013-10-14 | Discharge: 2013-10-15 | Disposition: A | Discharge status: Home / Self Care | Payer: Self-pay | Source: Ambulatory Visit | Attending: Obstetrics & Gynecology | Admitting: Obstetrics & Gynecology

## 2013-10-14 DIAGNOSIS — R0602 Shortness of breath: Secondary | ICD-10-CM | POA: Insufficient documentation

## 2013-10-14 DIAGNOSIS — R109 Unspecified abdominal pain: Secondary | ICD-10-CM | POA: Insufficient documentation

## 2013-10-14 DIAGNOSIS — O9989 Other specified diseases and conditions complicating pregnancy, childbirth and the puerperium: Principal | ICD-10-CM

## 2013-10-14 DIAGNOSIS — O26899 Other specified pregnancy related conditions, unspecified trimester: Secondary | ICD-10-CM

## 2013-10-14 DIAGNOSIS — K30 Functional dyspepsia: Secondary | ICD-10-CM

## 2013-10-14 DIAGNOSIS — R1013 Epigastric pain: Secondary | ICD-10-CM

## 2013-10-14 DIAGNOSIS — O99891 Other specified diseases and conditions complicating pregnancy: Secondary | ICD-10-CM | POA: Insufficient documentation

## 2013-10-14 DIAGNOSIS — K3189 Other diseases of stomach and duodenum: Secondary | ICD-10-CM

## 2013-10-14 HISTORY — DX: Other specified health status: Z78.9

## 2013-10-14 LAB — CBC
HCT: 31.5 % — ABNORMAL LOW (ref 36.0–46.0)
Hemoglobin: 10.7 g/dL — ABNORMAL LOW (ref 12.0–15.0)
MCH: 28.8 pg (ref 26.0–34.0)
MCHC: 34 g/dL (ref 30.0–36.0)
MCV: 84.7 fL (ref 78.0–100.0)
PLATELETS: 290 10*3/uL (ref 150–400)
RBC: 3.72 MIL/uL — ABNORMAL LOW (ref 3.87–5.11)
RDW: 15.1 % (ref 11.5–15.5)
WBC: 8.6 10*3/uL (ref 4.0–10.5)

## 2013-10-14 LAB — URINALYSIS, ROUTINE W REFLEX MICROSCOPIC
Bilirubin Urine: NEGATIVE
Glucose, UA: NEGATIVE mg/dL
HGB URINE DIPSTICK: NEGATIVE
Ketones, ur: NEGATIVE mg/dL
Leukocytes, UA: NEGATIVE
NITRITE: NEGATIVE
PROTEIN: NEGATIVE mg/dL
Specific Gravity, Urine: >1.03 — ABNORMAL HIGH (ref 1.005–1.030)
Urobilinogen, UA: 0.2 mg/dL (ref 0.0–1.0)
pH: 6 (ref 5.0–8.0)

## 2013-10-14 LAB — WET PREP, GENITAL
CLUE CELLS WET PREP: NONE SEEN
Trich, Wet Prep: NONE SEEN
YEAST WET PREP: NONE SEEN

## 2013-10-14 NOTE — MAU Provider Note (Signed)
Chief Complaint: Abdominal Pain   None    SUBJECTIVE HPI: Victoria Orozco is a 23 y.o. G2P1001 at 5837w0d by 8 week sono who presents to maternity admissions reporting abdominal pain starting above the umbilicus and radiating down to her RLQ with onset 2 hours prior to arrival in MAU.  She also reports intermittent shortness of breath, unaffected by activity or rest with onset 1-2 weeks ago.  She indicates she had this same symptom in her previous pregnancy but no diagnosis was found.  She had 1 episode of diarrhea immediately prior to onset of the abdominal pain but denies other constipation or diarrhea recently.  She reports her mother and sister, with whom she lives, have had GI virus this week.  She denies LOF, vaginal bleeding, vaginal itching/burning, urinary symptoms, h/a, dizziness, n/v, or fever/chills.     Past Medical History  Diagnosis Date  . No pertinent past medical history   . Medical history non-contributory    Past Surgical History  Procedure Laterality Date  . No past surgeries     History   Social History  . Marital Status: Single    Spouse Name: N/A    Number of Children: N/A  . Years of Education: N/A   Occupational History  . Not on file.   Social History Main Topics  . Smoking status: Never Smoker   . Smokeless tobacco: Never Used  . Alcohol Use: No  . Drug Use: No  . Sexual Activity: Yes    Birth Control/ Protection: None     Comment: none in last 24 hours   Other Topics Concern  . Not on file   Social History Narrative  . No narrative on file   No current facility-administered medications on file prior to encounter.   No current outpatient prescriptions on file prior to encounter.   No Known Allergies  ROS: Pertinent items in HPI  OBJECTIVE Blood pressure 105/55, pulse 66, temperature 98.7 F (37.1 C), temperature source Oral, resp. rate 18, height 5\' 3"  (1.6 m), weight 102.626 kg (226 lb 4 oz), SpO2 99.00%.  GENERAL:  Well-developed, well-nourished female in no acute distress reclining in MAU bed HEENT: Normocephalic HEART: normal rate, rhythm, heart sounds RESP: normal effort, lung sounds clear and equal bilaterally ABDOMEN: Soft, non-tender, no rebound tenderness, no guarding EXTREMITIES: Nontender, no edema NEURO: Alert and oriented Pelvic exam: Cervix pink, visually closed, without lesion, scant white creamy discharge, vaginal walls and external genitalia normal Bimanual exam: Cervix 0/long/high, firm, anterior, neg CMT, uterus nontender, ~13 week size, adnexa without tenderness, enlargement, or mass  FHT 160 by doppler  LAB RESULTS Results for orders placed during the hospital encounter of 10/14/13 (from the past 24 hour(s))  URINALYSIS, ROUTINE W REFLEX MICROSCOPIC     Status: Abnormal   Collection Time    10/14/13  9:34 PM      Result Value Ref Range   Color, Urine YELLOW  YELLOW   APPearance CLEAR  CLEAR   Specific Gravity, Urine >1.030 (*) 1.005 - 1.030   pH 6.0  5.0 - 8.0   Glucose, UA NEGATIVE  NEGATIVE mg/dL   Hgb urine dipstick NEGATIVE  NEGATIVE   Bilirubin Urine NEGATIVE  NEGATIVE   Ketones, ur NEGATIVE  NEGATIVE mg/dL   Protein, ur NEGATIVE  NEGATIVE mg/dL   Urobilinogen, UA 0.2  0.0 - 1.0 mg/dL   Nitrite NEGATIVE  NEGATIVE   Leukocytes, UA NEGATIVE  NEGATIVE  CBC     Status: Abnormal  Collection Time    10/14/13 10:50 PM      Result Value Ref Range   WBC 8.6  4.0 - 10.5 K/uL   RBC 3.72 (*) 3.87 - 5.11 MIL/uL   Hemoglobin 10.7 (*) 12.0 - 15.0 g/dL   HCT 16.1 (*) 09.6 - 04.5 %   MCV 84.7  78.0 - 100.0 fL   MCH 28.8  26.0 - 34.0 pg   MCHC 34.0  30.0 - 36.0 g/dL   RDW 40.9  81.1 - 91.4 %   Platelets 290  150 - 400 K/uL  WET PREP, GENITAL     Status: Abnormal   Collection Time    10/14/13 11:00 PM      Result Value Ref Range   Yeast Wet Prep HPF POC NONE SEEN  NONE SEEN   Trich, Wet Prep NONE SEEN  NONE SEEN   Clue Cells Wet Prep HPF POC NONE SEEN  NONE SEEN   WBC,  Wet Prep HPF POC FEW (*) NONE SEEN    ASSESSMENT 1. Gastrointestinal discomfort   2. Abdominal pain in pregnancy     PLAN Discharge home Increase PO fluids List of safe medications in pregnancy given F/U as scheduled for prenatal care Return to MAU as needed    Medication List    Notice   You have not been prescribed any medications.     Follow-up Information   Follow up with [facility] [place] [facility]. (If symptoms worsen.  Also follow up as scheduled with prenatal provider [facility].)    Contact information:   [address] [id number] [place] [address] [phone]      Sharen Counter Certified Nurse-Midwife 10/14/2013  11:41 PM

## 2013-10-14 NOTE — Discharge Instructions (Signed)
Viral Gastroenteritis °Viral gastroenteritis is also known as stomach flu. This condition affects the stomach and intestinal tract. It can cause sudden diarrhea and vomiting. The illness typically lasts 3 to 8 days. Most people develop an immune response that eventually gets rid of the virus. While this natural response develops, the virus can make you quite ill. °CAUSES  °Many different viruses can cause gastroenteritis, such as rotavirus or noroviruses. You can catch one of these viruses by consuming contaminated food or water. You may also catch a virus by sharing utensils or other personal items with an infected person or by touching a contaminated surface. °SYMPTOMS  °The most common symptoms are diarrhea and vomiting. These problems can cause a severe loss of body fluids (dehydration) and a body salt (electrolyte) imbalance. Other symptoms may include: °· Fever. °· Headache. °· Fatigue. °· Abdominal pain. °DIAGNOSIS  °Your caregiver can usually diagnose viral gastroenteritis based on your symptoms and a physical exam. A stool sample may also be taken to test for the presence of viruses or other infections. °TREATMENT  °This illness typically goes away on its own. Treatments are aimed at rehydration. The most serious cases of viral gastroenteritis involve vomiting so severely that you are not able to keep fluids down. In these cases, fluids must be given through an intravenous line (IV). °HOME CARE INSTRUCTIONS  °· Drink enough fluids to keep your urine clear or pale yellow. Drink small amounts of fluids frequently and increase the amounts as tolerated. °· Ask your caregiver for specific rehydration instructions. °· Avoid: °· Foods high in sugar. °· Alcohol. °· Carbonated drinks. °· Tobacco. °· Juice. °· Caffeine drinks. °· Extremely hot or cold fluids. °· Fatty, greasy foods. °· Too much intake of anything at one time. °· Dairy products until 24 to 48 hours after diarrhea stops. °· You may consume probiotics.  Probiotics are active cultures of beneficial bacteria. They may lessen the amount and number of diarrheal stools in adults. Probiotics can be found in yogurt with active cultures and in supplements. °· Wash your hands well to avoid spreading the virus. °· Only take over-the-counter or prescription medicines for pain, discomfort, or fever as directed by your caregiver. Do not give aspirin to children. Antidiarrheal medicines are not recommended. °· Ask your caregiver if you should continue to take your regular prescribed and over-the-counter medicines. °· Keep all follow-up appointments as directed by your caregiver. °SEEK IMMEDIATE MEDICAL CARE IF:  °· You are unable to keep fluids down. °· You do not urinate at least once every 6 to 8 hours. °· You develop shortness of breath. °· You notice blood in your stool or vomit. This may look like coffee grounds. °· You have abdominal pain that increases or is concentrated in one small area (localized). °· You have persistent vomiting or diarrhea. °· You have a fever. °· The patient is a child younger than 3 months, and he or she has a fever. °· The patient is a child older than 3 months, and he or she has a fever and persistent symptoms. °· The patient is a child older than 3 months, and he or she has a fever and symptoms suddenly get worse. °· The patient is a baby, and he or she has no tears when crying. °MAKE SURE YOU:  °· Understand these instructions. °· Will watch your condition. °· Will get help right away if you are not doing well or get worse. °Document Released: 07/20/2005 Document Revised: 10/12/2011 Document Reviewed: 05/06/2011 °  ExitCare Patient Information 2014 [place], [place].  Second Trimester of Pregnancy The second trimester is from week 13 through week 28, months 4 through 6. The second trimester is often a time when you feel your best. Your body has also adjusted to being pregnant, and you begin to feel better physically. Usually, morning sickness  has lessened or quit completely, you may have more energy, and you may have an increase in appetite. The second trimester is also a time when the fetus is growing rapidly. At the end of the sixth month, the fetus is about 9 inches long and weighs about 1 pounds. You will likely begin to feel the baby move (quickening) between 18 and 20 weeks of the pregnancy. BODY CHANGES Your body goes through many changes during pregnancy. The changes vary from woman to woman.   Your weight will continue to increase. You will notice your lower abdomen bulging out.  You may begin to get stretch marks on your hips, abdomen, and breasts.  You may develop headaches that can be relieved by medicines approved by your caregiver.  You may urinate more often because the fetus is pressing on your bladder.  You may develop or continue to have heartburn as a result of your pregnancy.  You may develop constipation because certain hormones are causing the muscles that push waste through your intestines to slow down.  You may develop hemorrhoids or swollen, bulging veins (varicose veins).  You may have back pain because of the weight gain and pregnancy hormones relaxing your joints between the bones in your pelvis and as a result of a shift in weight and the muscles that support your balance.  Your breasts will continue to grow and be tender.  Your gums may bleed and may be sensitive to brushing and flossing.  Dark spots or blotches (chloasma, mask of pregnancy) may develop on your face. This will likely fade after the baby is born.  A dark line from your belly button to the pubic area (linea nigra) may appear. This will likely fade after the baby is born. WHAT TO EXPECT AT YOUR PRENATAL VISITS During a routine prenatal visit:  You will be weighed to make sure you and the fetus are growing normally.  Your blood pressure will be taken.  Your abdomen will be measured to track your baby's growth.  The fetal  heartbeat will be listened to.  Any test results from the previous visit will be discussed. Your caregiver may ask you:  How you are feeling.  If you are feeling the baby move.  If you have had any abnormal symptoms, such as leaking fluid, bleeding, severe headaches, or abdominal cramping.  If you have any questions. Other tests that may be performed during your second trimester include:  Blood tests that check for:  Low iron levels (anemia).  Gestational diabetes (between 24 and 28 weeks).  Rh antibodies.  Urine tests to check for infections, diabetes, or protein in the urine.  An ultrasound to confirm the proper growth and development of the baby.  An amniocentesis to check for possible genetic problems.  Fetal screens for spina bifida and Down syndrome. HOME CARE INSTRUCTIONS   Avoid all smoking, herbs, alcohol, and unprescribed drugs. These chemicals affect the formation and growth of the baby.  Follow your caregiver's instructions regarding medicine use. There are medicines that are either safe or unsafe to take during pregnancy.  Exercise only as directed by your caregiver. Experiencing uterine cramps is a good sign  to stop exercising.  Continue to eat regular, healthy meals.  Wear a good support bra for breast tenderness.  Do not use hot tubs, steam rooms, or saunas.  Wear your seat belt at all times when driving.  Avoid raw meat, uncooked cheese, cat litter boxes, and soil used by cats. These carry germs that can cause birth defects in the baby.  Take your prenatal vitamins.  Try taking a stool softener (if your caregiver approves) if you develop constipation. Eat more high-fiber foods, such as fresh vegetables or fruit and whole grains. Drink plenty of fluids to keep your urine clear or pale yellow.  Take warm sitz baths to soothe any pain or discomfort caused by hemorrhoids. Use hemorrhoid cream if your caregiver approves.  If you develop varicose veins,  wear support hose. Elevate your feet for 15 minutes, 3 4 times a day. Limit salt in your diet.  Avoid heavy lifting, wear low heel shoes, and practice good posture.  Rest with your legs elevated if you have leg cramps or low back pain.  Visit your dentist if you have not gone yet during your pregnancy. Use a soft toothbrush to brush your teeth and be gentle when you floss.  A sexual relationship may be continued unless your caregiver directs you otherwise.  Continue to go to all your prenatal visits as directed by your caregiver. SEEK MEDICAL CARE IF:   You have dizziness.  You have mild pelvic cramps, pelvic pressure, or nagging pain in the abdominal area.  You have persistent nausea, vomiting, or diarrhea.  You have a bad smelling vaginal discharge.  You have pain with urination. SEEK IMMEDIATE MEDICAL CARE IF:   You have a fever.  You are leaking fluid from your vagina.  You have spotting or bleeding from your vagina.  You have severe abdominal cramping or pain.  You have rapid weight gain or loss.  You have shortness of breath with chest pain.  You notice sudden or extreme swelling of your face, hands, ankles, feet, or legs.  You have not felt your baby move in over an hour.  You have severe headaches that do not go away with medicine.  You have vision changes. Document Released: 07/14/2001 Document Revised: 03/22/2013 Document Reviewed: 09/20/2012 [facility] Patient Information 2014 [place], [place].

## 2013-10-14 NOTE — MAU Note (Addendum)
Positive HPT a week ago because her mother wanted to make sure.  Pt states she has been here before and thinks she is due in September.  Denies bleeding.  Having pain on lower abd to right lower abd.  States today it was hard to catch her breath.  [place] [facility]  First OB appt pt says.

## 2013-10-15 NOTE — MAU Provider Note (Signed)
Attestation of Attending Supervision of Advanced Practitioner (CNM/NP): Evaluation and management procedures were performed by the Advanced Practitioner under my supervision and collaboration. I have reviewed the Advanced Practitioner's note and chart, and I agree with the management and plan.  Nicholai Willette H. 12:23 AM

## 2013-10-16 LAB — GC/CHLAMYDIA PROBE AMP
CT Probe RNA: NEGATIVE
GC Probe RNA: NEGATIVE

## 2014-06-04 ENCOUNTER — Encounter (HOSPITAL_COMMUNITY): Payer: Self-pay

## 2014-06-04 ENCOUNTER — Encounter (HOSPITAL_COMMUNITY): Payer: Self-pay | Admitting: *Deleted

## 2014-07-17 ENCOUNTER — Encounter (HOSPITAL_COMMUNITY): Payer: Self-pay | Admitting: *Deleted

## 2014-08-03 HISTORY — PX: CHOLECYSTECTOMY: SHX55

## 2014-11-20 ENCOUNTER — Encounter (HOSPITAL_COMMUNITY): Payer: Self-pay | Admitting: *Deleted

## 2014-11-20 ENCOUNTER — Emergency Department (HOSPITAL_COMMUNITY)
Admission: EM | Admit: 2014-11-20 | Discharge: 2014-11-20 | Disposition: A | Discharge status: Home / Self Care | Payer: Self-pay | Attending: Emergency Medicine | Admitting: Emergency Medicine

## 2014-11-20 DIAGNOSIS — R42 Dizziness and giddiness: Secondary | ICD-10-CM | POA: Insufficient documentation

## 2014-11-20 DIAGNOSIS — N939 Abnormal uterine and vaginal bleeding, unspecified: Secondary | ICD-10-CM | POA: Insufficient documentation

## 2014-11-20 DIAGNOSIS — Z8719 Personal history of other diseases of the digestive system: Secondary | ICD-10-CM | POA: Insufficient documentation

## 2014-11-20 DIAGNOSIS — Z3202 Encounter for pregnancy test, result negative: Secondary | ICD-10-CM | POA: Insufficient documentation

## 2014-11-20 DIAGNOSIS — R0602 Shortness of breath: Secondary | ICD-10-CM | POA: Insufficient documentation

## 2014-11-20 HISTORY — DX: Calculus of gallbladder without cholecystitis without obstruction: K80.20

## 2014-11-20 LAB — CBC
HCT: 39.7 % (ref 36.0–46.0)
HEMOGLOBIN: 13.5 g/dL (ref 12.0–15.0)
MCH: 28.8 pg (ref 26.0–34.0)
MCHC: 34 g/dL (ref 30.0–36.0)
MCV: 84.8 fL (ref 78.0–100.0)
PLATELETS: 382 10*3/uL (ref 150–400)
RBC: 4.68 MIL/uL (ref 3.87–5.11)
RDW: 14.2 % (ref 11.5–15.5)
WBC: 9.6 10*3/uL (ref 4.0–10.5)

## 2014-11-20 LAB — WET PREP, GENITAL
Clue Cells Wet Prep HPF POC: NONE SEEN
Trich, Wet Prep: NONE SEEN

## 2014-11-20 LAB — BASIC METABOLIC PANEL
ANION GAP: 13 (ref 5–15)
BUN: 12 mg/dL (ref 6–23)
CHLORIDE: 106 mmol/L (ref 96–112)
CO2: 22 mmol/L (ref 19–32)
Calcium: 9.7 mg/dL (ref 8.4–10.5)
Creatinine, Ser: 0.68 mg/dL (ref 0.50–1.10)
GFR calc non Af Amer: >90 mL/min (ref >90)
Glucose, Bld: 113 mg/dL — ABNORMAL HIGH (ref 70–99)
POTASSIUM: 3.5 mmol/L (ref 3.5–5.1)
Sodium: 141 mmol/L (ref 135–145)

## 2014-11-20 LAB — I-STAT BETA HCG BLOOD, ED (MC, WL, AP ONLY): I-stat hCG, quantitative: <5 m[IU]/mL (ref <5)

## 2014-11-20 NOTE — ED Provider Notes (Signed)
CSN: [id number]     Arrival date & time 11/20/14  1857 History  This chart was scribed for non-physician practitioner Roxy Horsemanobert Aarti Mankowski, PA-C working with Arby BarretteMarcy Pfeiffer, MD by Littie Deedsichard Sun, ED Scribe. This patient was seen in room TR03C/TR03C and the patient's care was started at 8:40 PM.       Chief Complaint  Patient presents with  . Vaginal Bleeding   The history is provided by the patient. No language interpreter was used.   HPI Comments: Victoria Orozco is a 24 y.o. female who presents to the Emergency Department complaining of intermittent vaginal bleeding that started 3 months ago. She came today because she started noticing clots. She states she only has problems with vaginal bleeding in the mornings. Patient also reports having occasional SOB and occasional lightheadedness. She has been using pads, but not during the day. Patient denies abdominal pain, dysuria, and dizziness. No OB/GYN per patient.   Past Medical History  Diagnosis Date  . No pertinent past medical history   . Medical history non-contributory   . Gall stones    Past Surgical History  Procedure Laterality Date  . No past surgeries     Family History  Problem Relation Age of Onset  . Heart disease Mother   . Diabetes Mother   . Hyperlipidemia Mother   . Anesthesia problems Neg Hx   . Hypotension Neg Hx   . Malignant hyperthermia Neg Hx   . Pseudochol deficiency Neg Hx   . Mental retardation Father     downs syndrome   History  Substance Use Topics  . Smoking status: Never Smoker   . Smokeless tobacco: Never Used  . Alcohol Use: No   OB History    Gravida Para Term Preterm AB TAB SAB Ectopic Multiple Living   2 1 1  0 0 0 0 0 0 1     Review of Systems  Constitutional: Negative for fever and chills.  Respiratory: Positive for shortness of breath.   Cardiovascular: Negative for chest pain.  Gastrointestinal: Negative for nausea, vomiting, abdominal pain, diarrhea and constipation.   Genitourinary: Positive for vaginal bleeding. Negative for dysuria.  Neurological: Positive for light-headedness. Negative for dizziness.      Allergies  Review of patient's allergies indicates no known allergies.  Home Medications   Prior to Admission medications   Not on File   BP 140/67 mmHg  Pulse 101  Temp(Src) 98.1 F (36.7 C) (Oral)  Resp 18  SpO2 98% Physical Exam  Constitutional: She is oriented to person, place, and time. She appears well-developed and well-nourished. No distress.  HENT:  Head: Normocephalic and atraumatic.  Mouth/Throat: Oropharynx is clear and moist. No oropharyngeal exudate.  Eyes: Pupils are equal, round, and reactive to light.  Neck: Neck supple.  Cardiovascular: Normal rate, regular rhythm and normal heart sounds.  Exam reveals no gallop and no friction rub.   No murmur heard. Normal on my exam  Pulmonary/Chest: Effort normal and breath sounds normal. No respiratory distress. She has no wheezes.  Abdominal: Soft. She exhibits no distension. There is no tenderness.  Genitourinary:  Pelvic exam chaperoned by female ER tech, no right or left adnexal tenderness, no uterine tenderness, no vaginal discharge, scant blood, no hemorrhage, no CMT or friability, no foreign body, no injury to the external genitalia, no other significant findings   Musculoskeletal: She exhibits no edema.  Neurological: She is alert and oriented to person, place, and time. No cranial nerve deficit.  Skin: Skin is  warm and dry. No rash noted.  Psychiatric: She has a normal mood and affect. Her behavior is normal.  Nursing note and vitals reviewed.   ED Course  Procedures  DIAGNOSTIC STUDIES: Oxygen Saturation is 98% on room air, normal by my interpretation.    COORDINATION OF CARE: 8:43 PM-Discussed treatment plan which includes pelvic exam and  with pt at bedside and pt agreed to plan.    Labs Review Labs Reviewed  BASIC METABOLIC PANEL - Abnormal; Notable for  the following:    Glucose, Bld 113 (*)    All other components within normal limits  WET PREP, GENITAL  CBC  I-STAT BETA HCG BLOOD, ED ([facility], [facility], [facility] ONLY)  GC/CHLAMYDIA PROBE AMP ([place])    Imaging Review No results found.   EKG Interpretation None      MDM   Final diagnoses:  Vaginal bleeding    Patient with intermittent vaginal bleeding 3 months. Intermittent shortness breath, but no shortness breath or dizziness now. H&H stable. Vital signs are stable. No hemorrhage. Will discharge to home with OB/GYN follow-up. Pregnancy test negative. No pelvic pain.  I personally performed the services described in this documentation, which was scribed in my presence. The recorded information has been reviewed and is accurate.  Filed Vitals:   11/20/14 1904  BP: 140/67  Pulse: 101  Temp: 98.1 F (36.7 C)  Resp: [id number] PA-C 11/20/14 2150  Arby Barrette, MD 11/28/14 [phone]

## 2014-11-20 NOTE — ED Notes (Signed)
Pt states that she has had intermittent vaginal bleeding for 3 months. Pt states that blood is dark and denies odor. Denies abdominal pain. States that she has had breast tenderness as well.

## 2014-11-20 NOTE — Discharge Instructions (Signed)
Abnormal Uterine Bleeding Abnormal uterine bleeding can affect women at various stages in life, including teenagers, women in their reproductive years, pregnant women, and women who have reached menopause. Several kinds of uterine bleeding are considered abnormal, including:  Bleeding or spotting between periods.   Bleeding after sexual intercourse.   Bleeding that is heavier or more than normal.   Periods that last longer than usual.  Bleeding after menopause.  Many cases of abnormal uterine bleeding are minor and simple to treat, while others are more serious. Any type of abnormal bleeding should be evaluated by your health care provider. Treatment will depend on the cause of the bleeding. HOME CARE INSTRUCTIONS Monitor your condition for any changes. The following actions may help to alleviate any discomfort you are experiencing:  Avoid the use of tampons and douches as directed by your health care provider.  Change your pads frequently. You should get regular pelvic exams and Pap tests. Keep all follow-up appointments for diagnostic tests as directed by your health care provider.  SEEK MEDICAL CARE IF:   Your bleeding lasts more than 1 week.   You feel dizzy at times.  SEEK IMMEDIATE MEDICAL CARE IF:   You pass out.   You are changing pads every 15 to 30 minutes.   You have abdominal pain.  You have a fever.   You become sweaty or weak.   You are passing large blood clots from the vagina.   You start to feel nauseous and vomit. MAKE SURE YOU:   Understand these instructions.  Will watch your condition.  Will get help right away if you are not doing well or get worse. Document Released: 07/20/2005 Document Revised: 07/25/2013 Document Reviewed: 02/16/2013 ExitCare Patient Information 2015 [employer]. This information is not intended to replace advice given to you by your health care provider. Make sure you discuss any questions you have with your  health care provider.  

## 2014-11-21 LAB — GC/CHLAMYDIA PROBE AMP (~~LOC~~) NOT AT ARMC
CHLAMYDIA, DNA PROBE: NEGATIVE
Neisseria Gonorrhea: NEGATIVE

## 2014-11-26 ENCOUNTER — Inpatient Hospital Stay (HOSPITAL_COMMUNITY)
Admission: AD | Admit: 2014-11-26 | Discharge: 2014-11-26 | Disposition: A | Discharge status: Home / Self Care | Payer: Self-pay | Source: Ambulatory Visit | Attending: Obstetrics & Gynecology | Admitting: Obstetrics & Gynecology

## 2014-11-26 ENCOUNTER — Encounter (HOSPITAL_COMMUNITY): Payer: Self-pay | Admitting: *Deleted

## 2014-11-26 DIAGNOSIS — N938 Other specified abnormal uterine and vaginal bleeding: Secondary | ICD-10-CM | POA: Insufficient documentation

## 2014-11-26 LAB — CBC WITH DIFFERENTIAL/PLATELET
Basophils Absolute: 0 10*3/uL (ref 0.0–0.1)
Basophils Relative: 0 % (ref 0–1)
Eosinophils Absolute: 0.2 10*3/uL (ref 0.0–0.7)
Eosinophils Relative: 2 % (ref 0–5)
HCT: 38 % (ref 36.0–46.0)
Hemoglobin: 13.1 g/dL (ref 12.0–15.0)
LYMPHS ABS: 4.3 10*3/uL — AB (ref 0.7–4.0)
Lymphocytes Relative: 43 % (ref 12–46)
MCH: 29 pg (ref 26.0–34.0)
MCHC: 34.5 g/dL (ref 30.0–36.0)
MCV: 84.3 fL (ref 78.0–100.0)
Monocytes Absolute: 0.6 10*3/uL (ref 0.1–1.0)
Monocytes Relative: 6 % (ref 3–12)
Neutro Abs: 4.8 10*3/uL (ref 1.7–7.7)
Neutrophils Relative %: 49 % (ref 43–77)
PLATELETS: 384 10*3/uL (ref 150–400)
RBC: 4.51 MIL/uL (ref 3.87–5.11)
RDW: 14.6 % (ref 11.5–15.5)
WBC: 9.8 10*3/uL (ref 4.0–10.5)

## 2014-11-26 LAB — POCT PREGNANCY, URINE: Preg Test, Ur: NEGATIVE

## 2014-11-26 MED ORDER — NORETHINDRONE ACET-ETHINYL EST 1.5-30 MG-MCG PO TABS: 1.0000 | ORAL_TABLET | Freq: Every day | ORAL | Status: DC

## 2014-11-26 NOTE — MAU Note (Addendum)
Pt complains of bleeding for four months, light and heavy. Dark to bright red. Pt denies pain. Has light headness, and dizzy, with black spots in eyes.

## 2014-11-26 NOTE — MAU Provider Note (Signed)
  History   24 yo female in with c/o bleeding for the past 4 mos. States infant is 677 mos old.. States bleeding ranges from heavy to light. Denies pain in abd.  CSN: [id number]  Arrival date and time: 11/26/14 2029   None     No chief complaint on file.  HPI  OB History    Gravida Para Term Preterm AB TAB SAB Ectopic Multiple Living   2 2 2  0 0 0 0 0 0 2      Past Medical History  Diagnosis Date  . No pertinent past medical history   . Medical history non-contributory   . Gall stones     Past Surgical History  Procedure Laterality Date  . No past surgeries      Family History  Problem Relation Age of Onset  . Heart disease Mother   . Diabetes Mother   . Hyperlipidemia Mother   . Anesthesia problems Neg Hx   . Hypotension Neg Hx   . Malignant hyperthermia Neg Hx   . Pseudochol deficiency Neg Hx   . Mental retardation Father     downs syndrome    History  Substance Use Topics  . Smoking status: Never Smoker   . Smokeless tobacco: Never Used  . Alcohol Use: No    Allergies: No Known Allergies  No prescriptions prior to admission    Review of Systems  Constitutional: Negative.   HENT: Negative.   Eyes: Negative.   Respiratory: Negative.   Cardiovascular: Negative.   Gastrointestinal: Negative.   Genitourinary: Negative.   Musculoskeletal: Negative.   Skin: Negative.   Neurological: Negative.   Endo/Heme/Allergies: Negative.   Psychiatric/Behavioral: Negative.    Physical Exam   Blood pressure 134/78, pulse 91, temperature 98.2 F (36.8 C), temperature source Oral, resp. rate 16, weight 227 lb 6.4 oz (103.148 kg), unknown if currently breastfeeding.  Physical Exam  Constitutional: She is oriented to person, place, and time. She appears well-developed and well-nourished.  HENT:  Head: Normocephalic.  Eyes: Pupils are equal, round, and reactive to light.  Neck: Normal range of motion.  Cardiovascular: Normal rate, regular rhythm, normal heart  sounds and intact distal pulses.   Respiratory: Effort normal and breath sounds normal.  GI: Soft. Bowel sounds are normal.  Genitourinary: Vagina normal.  Musculoskeletal: Normal range of motion.  Neurological: She is alert and oriented to person, place, and time. She has normal reflexes.  Skin: Skin is warm and dry.  Psychiatric: She has a normal mood and affect. Her behavior is normal. Judgment and thought content normal.    MAU Course  Procedures  MDM DUB   Assessment and Plan  Sterile spec exam WNL. Pt having scant amt dark vag bleeding. Bi manual exam normal. Discussed Tx options with pt, risk and benefits. Pt agrees to OCP's to control DUB. Will D/C home with normal  cbc  LAWSON, MARIE DARLENE 11/26/2014, 8:54 PM

## 2014-11-26 NOTE — Discharge Instructions (Signed)
Hemorragia uterina disfuncional (Dysfunctional Uterine Bleeding) Normalmente, los perodos menstruales comienzan en las jvenes de entre 11 y 17 aos. Un ciclo o perodo menstrual puede repetirse entre los 23 das y los 35 das y dura entre 1 y 7 das. Entre los 12 y los 14 das antes de que comience el ciclo menstrual, se produce la ovulacin (los ovarios producen vulos). Cuando cuente los periodos, hgalo desde el primer da del comienzo de la hemorragia del perodo anterior hasta el primer da de la hemorragia del perodo siguiente. La hemorragia uterina disfuncional (anormal) es diferente del perodo menstrual normal. Los perodos pueden comenzar antes o despus que lo habitual. Pueden ser menos abundantes, presentar cogulos, o ser ms abundantes. Puede tener hemorragias entre los perodos o saltear un perodo o ms. Puede tener hemorragia luego de mantener relaciones sexuales, despus de la menopausia o faltarle el perodo menstrual. CAUSAS  Embarazo (normal, aborto espontneo, embarazo ectpico).  DIU (dispositivo intrauterino, anticonceptivo)  Pldoras anticonceptivas  Tratamiento hormonal  La menopausia  Infeccin en el cervix.  Problemas de coagulacin.  Infecciones de la superficie interna del tero.  Endometriosis: la superficie interna del tero se desarrolla en la pelvis y otros rganos femeninos.  Adherencias (tejido cicatrizal) dentro del tero.  Obesidad o severa prdida de peso.  Plipos en el tero.  Cncer en el crvix, la vagina o el tero.  Quiste de ovarios o Sndrome ovrico poliqustico.  Otras enfermedades (diabetes, enfermedad de la tiroides, etc).  Fibromas en el tero (tumores no cancerosos).  Problemas con las hormonas femeninas.  Hiperplasia del endometrio: capa gruesa y clulas agrandadas dentro del tero.  Medicamentos que interfieren con la ovulacin.  Radiacin en la pelvis o el abdomen.  Quimioterapia. DIAGNSTICO  El mdico  analizar la historia de sus perodos menstruales, los medicamentos que toma, los cambios en el peso corporal, el estrs de la vida diaria y cualquier problema mdico que tenga.  El mdico realizar un examen fsico, incluyendo un examen plvico.  Tambin le solicitar anlisis para completar el diagnstico. Ellos son:  Papanicolau.  Anlisis de sangre.  Cultivos para descartar infecciones.  Tomografa computada.  Ultrasonido.  Histeroscopa.  Laparoscopa.  Resonancia magntica.  Histerosalpingografa.  Dilatacin y curetaje.  Biopsia de endometrio. TRATAMIENTO El tratamiento depender de la causa de la hemorragia.. El tratamiento consiste en:  Observacin de los perodos menstruales durante algunos meses.  Prescripcin de medicamentos como:  Antibiticos:  Hormonas.  Pldoras anticonceptivas  Retirar el DIU (dispositivo intrauterino, anticonceptivo).  Ciruga:  Dilatacin y curetaje (raspado y remocin de tejido de la zona interna del tero).  Laparoscopa (examen del interior del abdomen con un tubo con luz).  Ablacin uterina (destruccin de la membrana que cubre el tero con corriente elctrica, rayos lser, congelamiento o calor ).  Histeroscopa (examen del cuello y el tero con un tubo con luz).  Histerectoma (extirpacin del tero). INSTRUCCIONES PARA EL CUIDADO DOMICILIARIO  Si el profesional que la asiste le prescribe medicamentos, tmelos tal como se le indic. No cambie ni reemplace medicamentos sin consultarlo con el profesional.  Las hemorragias de larga duracin pueden traen como consecuencia un dficit de hierro. El profesional que lo asiste podr prescribirle hierro. Esto ayuda a reponer el hierro que el organismo pierde luego de una hemorragia abundante. Tome los medicamentos tal como se le indic.  No tome aspirina o medicamentos que la contengan u desde una semana antes del perodo menstrual ni durante el mismo. La aspirina puede hacer  que la hemorragia empeore.    Si necesita cambiar el apsito o el tampn mas de una vez cada 2 horas, permanezca en cama con los pies elevados y aplique una compresa fra en la zona baja del abdomen. Descanse todo lo que pueda hasta que la hemorragia se detenga.  Consuma alimentos balanceados. Coma alimentos ricos en hierro. Por ejemplo:  Vegetales verdes frescos.  Cereales integrales y cereales y panes con salvado.  Huevos.  Carnes.  Hgado.  No trate de perder peso hasta que la hemorragia anormal se detenga y los niveles de hierro en la sangre vuelvan a la normalidad. No levante pesos de ms de 10 libras (4.5 kg.) ni realice actividades extenuantes mientras tenga la hemorragia.  Durante un par de meses tome nota en un almanaque y marque el comienzo y el fin de su perodo menstrual y el tipo de sangrado (escaso, medio, abundante, gotas, cogulos o falta de perodo). El objetivo es que su mdico evale mejor el problema. SOLICITE ATENCIN MDICA SI:  Debe cambiar el apsito o el tampn ms de una vez cada hora.  Se siente mareada o dbil.  Tiene algn problema que pueda relacionarse con el medicamento que est tomando.  Siente dolor.  Desea retirar su DIU.  Quiere suspender o cambiar sus pldoras anticonceptivas u hormonas.  Tiene algn tipo de hemorragia anormal mencionada anteriormente.  Tiene ms de 16 aos y an no ha tenido su perodo menstrual.  Tiene 55 aos y an tiene perodos menstruales.  Tiene alguno de los sntomas mencionados anteriormente.  Aparece una erupcin cutnea. SOLICITE ATENCIN MDICA DE INMEDIATO SI:  La temperatura oral se eleva sin motivo por encima de 38,9 C (102 F).  Comienza a sentir escalofros.  Debe cambiar el apsito o el tampn ms de una vez cada hora.  Siente dolor abdominal.  Pierde el conocimiento, se desmaya. Document Released: 04/29/2005 Document Revised: 04/13/2012 ExitCare Patient Information 2015 [employer]. This  information is not intended to replace advice given to you by your health care provider. Make sure you discuss any questions you have with your health care provider.  

## 2015-03-25 ENCOUNTER — Encounter (HOSPITAL_COMMUNITY): Payer: Self-pay | Admitting: *Deleted

## 2015-05-31 ENCOUNTER — Encounter (HOSPITAL_COMMUNITY): Payer: Self-pay | Admitting: *Deleted

## 2015-05-31 ENCOUNTER — Emergency Department (HOSPITAL_COMMUNITY)
Admission: EM | Admit: 2015-05-31 | Discharge: 2015-05-31 | Disposition: A | Discharge status: Home / Self Care | Payer: Self-pay | Attending: Emergency Medicine | Admitting: Emergency Medicine

## 2015-05-31 DIAGNOSIS — T23021A Burn of unspecified degree of single right finger (nail) except thumb, initial encounter: Secondary | ICD-10-CM

## 2015-05-31 DIAGNOSIS — Y9389 Activity, other specified: Secondary | ICD-10-CM | POA: Insufficient documentation

## 2015-05-31 DIAGNOSIS — Y929 Unspecified place or not applicable: Secondary | ICD-10-CM | POA: Insufficient documentation

## 2015-05-31 DIAGNOSIS — Y999 Unspecified external cause status: Secondary | ICD-10-CM | POA: Insufficient documentation

## 2015-05-31 DIAGNOSIS — T23231A Burn of second degree of multiple right fingers (nail), not including thumb, initial encounter: Secondary | ICD-10-CM | POA: Insufficient documentation

## 2015-05-31 DIAGNOSIS — T23131A Burn of first degree of multiple right fingers (nail), not including thumb, initial encounter: Secondary | ICD-10-CM | POA: Insufficient documentation

## 2015-05-31 DIAGNOSIS — Z23 Encounter for immunization: Secondary | ICD-10-CM | POA: Insufficient documentation

## 2015-05-31 DIAGNOSIS — Y278XXA Contact with other hot objects, undetermined intent, initial encounter: Secondary | ICD-10-CM | POA: Insufficient documentation

## 2015-05-31 DIAGNOSIS — Z8719 Personal history of other diseases of the digestive system: Secondary | ICD-10-CM | POA: Insufficient documentation

## 2015-05-31 MED ORDER — SILVER SULFADIAZINE 1 % EX CREA
TOPICAL_CREAM | Freq: Once | CUTANEOUS | Status: AC
  Administered 2015-05-31: 20:00:00 via TOPICAL
  Filled 2015-05-31: qty 85

## 2015-05-31 MED ORDER — HYDROCODONE-ACETAMINOPHEN 5-325 MG PO TABS
1.0000 | ORAL_TABLET | Freq: Once | ORAL | Status: AC
  Administered 2015-05-31: 1 via ORAL
  Filled 2015-05-31: qty 1

## 2015-05-31 MED ORDER — TETANUS-DIPHTH-ACELL PERTUSSIS 5-2.5-18.5 LF-MCG/0.5 IM SUSP
0.5000 mL | Freq: Once | INTRAMUSCULAR | Status: AC
  Administered 2015-05-31: 0.5 mL via INTRAMUSCULAR
  Filled 2015-05-31: qty 0.5

## 2015-05-31 MED ORDER — HYDROCODONE-ACETAMINOPHEN 5-325 MG PO TABS: 1.0000 | ORAL_TABLET | Freq: Four times a day (QID) | ORAL | Status: DC | PRN

## 2015-05-31 NOTE — Discharge Instructions (Signed)
Take motrin for pain.  Take vicodin for severe pain.  Apply ice to the area.  Use silverdene twice daily until it heals up.  You are likely going to have blisters.   See your doctor.   Return to ER if you have worse pain, fever, purulent discharge from wound.    Burn Care Burns hurt your skin. When your skin is hurt, it is easier to get an infection. Follow your doctor's directions to help prevent an infection. HOME CARE  Wash your hands well before you change your bandage.  Change your bandage as often as told by your doctor.  Remove the old bandage. If the bandage sticks, soak it off with cool, clean water.  Gently clean the burn with mild soap and water.  Pat the burn dry with a clean, dry cloth.  Put a thin layer of medicated cream on the burn.  Put a clean bandage on as told by your doctor.  Keep the bandage clean and dry.  Raise (elevate) the burn for the first 24 hours. After that, follow your doctor's directions.  Only take medicine as told by your doctor. GET HELP RIGHT AWAY IF:   You have too much pain.  The skin near the burn is red, tender, puffy (swollen), or has red streaks.  The burn area has yellowish white fluid (pus) or a bad smell coming from it.  You have a fever. MAKE SURE YOU:   Understand these instructions.  Will watch your condition.  Will get help right away if you are not doing well or get worse.   This information is not intended to replace advice given to you by your health care provider. Make sure you discuss any questions you have with your health care provider.   Document Released: 04/28/2008 Document Revised: 10/12/2011 Document Reviewed: 12/10/2010 Elsevier Interactive Patient Education [employer]2016 [employer] Inc[employer]

## 2015-05-31 NOTE — ED Notes (Signed)
The pt ius c/o a burn to   The fingers on her rt hand.  She caught her hand on the bottom of a melting spoon  Blisters on her fingers  Some open others intact still

## 2015-05-31 NOTE — ED Provider Notes (Signed)
CSN: [id number]     Arrival date & time 05/31/15  2006 History   First MD Initiated Contact with Patient 05/31/15 2016     Chief Complaint  Patient presents with  . Hand Burn     (Consider location/radiation/quality/duration/timing/severity/associated sxs/prior Treatment) The history is provided by the patient.  Victoria Orozco is a 24 y.o. female otherwise healthy here presenting with right third and fourth finger burn. Patient was trying to grab a spoon handle and didn't realize that it was hot and then had burns to right third and fourth finger. Noticed blisters right away. Denies other injuries. Unknown tetanus.   Past Medical History  Diagnosis Date  . No pertinent past medical history   . Medical history non-contributory   . Gall stones    Past Surgical History  Procedure Laterality Date  . No past surgeries     Family History  Problem Relation Age of Onset  . Heart disease Mother   . Diabetes Mother   . Hyperlipidemia Mother   . Anesthesia problems Neg Hx   . Hypotension Neg Hx   . Malignant hyperthermia Neg Hx   . Pseudochol deficiency Neg Hx   . Mental retardation Father     downs syndrome   Social History  Substance Use Topics  . Smoking status: Never Smoker   . Smokeless tobacco: Never Used  . Alcohol Use: No   OB History    Gravida Para Term Preterm AB TAB SAB Ectopic Multiple Living   0 0 0 0 0  1     Review of Systems  Skin: Positive for wound.  All other systems reviewed and are negative.     Allergies  Review of patient's allergies indicates no known allergies.  Home Medications   Prior to Admission medications   Medication Sig Start Date End Date Taking? Authorizing Provider  Norethindrone Acetate-Ethinyl Estradiol (LOESTRIN 1.5/30, 21,) 1.5-30 MG-[facility] tablet Take 1 tablet by mouth daily. Patient not taking: Reported on 05/31/2015 11/26/14   Montez Morita, CNM   BP 139/57 mmHg  Pulse 66  Temp(Src) 98.5 F (36.9 C) (Oral)   Resp 16  SpO2 100%  LMP 05/24/2015  Breastfeeding? Yes Physical Exam  Constitutional: She appears well-developed and well-nourished.  HENT:  Head: Normocephalic.  Eyes: Pupils are equal, round, and reactive to light.  Neck: Normal range of motion.  Cardiovascular: Normal rate.   Abdominal: Soft.  Musculoskeletal:  R 3rd and 4th finger with blisters and 1st degree burns. Good capillary refill. Not circumferential. No foreign body. Slightly dec sensation middle of blisters but skin is not white   Neurological: She is alert.  Skin: Skin is warm.  Psychiatric: She has a normal mood and affect. Her behavior is normal. Judgment and thought content normal.  Nursing note and vitals reviewed.   ED Course  Procedures (including critical care time) Labs Review Labs Reviewed - No data to display  Imaging Review No results found. I have personally reviewed and evaluated these images and lab results as part of my medical decision-making.   EKG Interpretation None      MDM   Final diagnoses:  None    Victoria Orozco is a 24 y.o. female here with R 3rd, 4th finger burn. Mostly first degree, some second degree burns. Maybe one or two spots with third degree. Not circumferential, good capillary refill. Given vicodin. Tdap updated. Given silverdene cream. Stable for dc. Likely will blister more  Richardean Canalavid H Yao, MD 05/31/15 2138

## 2015-05-31 NOTE — ED Notes (Signed)
Dr. Yao at the bedside. 

## 2015-09-19 ENCOUNTER — Inpatient Hospital Stay (HOSPITAL_COMMUNITY)
Admission: AD | Admit: 2015-09-19 | Discharge: 2015-09-19 | Disposition: A | Discharge status: Home / Self Care | Payer: Self-pay | Source: Ambulatory Visit | Attending: Obstetrics & Gynecology | Admitting: Obstetrics & Gynecology

## 2015-09-19 ENCOUNTER — Inpatient Hospital Stay (HOSPITAL_COMMUNITY): Payer: Self-pay

## 2015-09-19 ENCOUNTER — Encounter (HOSPITAL_COMMUNITY): Payer: Self-pay | Admitting: *Deleted

## 2015-09-19 DIAGNOSIS — R1032 Left lower quadrant pain: Secondary | ICD-10-CM | POA: Insufficient documentation

## 2015-09-19 DIAGNOSIS — Z3492 Encounter for supervision of normal pregnancy, unspecified, second trimester: Secondary | ICD-10-CM

## 2015-09-19 DIAGNOSIS — M549 Dorsalgia, unspecified: Secondary | ICD-10-CM | POA: Insufficient documentation

## 2015-09-19 DIAGNOSIS — Z3201 Encounter for pregnancy test, result positive: Secondary | ICD-10-CM | POA: Insufficient documentation

## 2015-09-19 DIAGNOSIS — N83201 Unspecified ovarian cyst, right side: Secondary | ICD-10-CM | POA: Insufficient documentation

## 2015-09-19 DIAGNOSIS — R102 Pelvic and perineal pain: Secondary | ICD-10-CM | POA: Insufficient documentation

## 2015-09-19 DIAGNOSIS — Z3A12 12 weeks gestation of pregnancy: Secondary | ICD-10-CM | POA: Insufficient documentation

## 2015-09-19 DIAGNOSIS — O26891 Other specified pregnancy related conditions, first trimester: Secondary | ICD-10-CM | POA: Insufficient documentation

## 2015-09-19 DIAGNOSIS — K5900 Constipation, unspecified: Secondary | ICD-10-CM | POA: Insufficient documentation

## 2015-09-19 LAB — CBC
HEMATOCRIT: 32.9 % — AB (ref 36.0–46.0)
Hemoglobin: 11.6 g/dL — ABNORMAL LOW (ref 12.0–15.0)
MCH: 30.4 pg (ref 26.0–34.0)
MCHC: 35.3 g/dL (ref 30.0–36.0)
MCV: 86.1 fL (ref 78.0–100.0)
Platelets: 306 10*3/uL (ref 150–400)
RBC: 3.82 MIL/uL — AB (ref 3.87–5.11)
RDW: 13.1 % (ref 11.5–15.5)
WBC: 9.6 10*3/uL (ref 4.0–10.5)

## 2015-09-19 LAB — URINALYSIS, ROUTINE W REFLEX MICROSCOPIC
Bilirubin Urine: NEGATIVE
GLUCOSE, UA: NEGATIVE mg/dL
HGB URINE DIPSTICK: NEGATIVE
Ketones, ur: NEGATIVE mg/dL
Nitrite: POSITIVE — AB
Protein, ur: NEGATIVE mg/dL
pH: 6 (ref 5.0–8.0)

## 2015-09-19 LAB — ABO/RH: ABO/RH(D): A POS

## 2015-09-19 LAB — HCG, QUANTITATIVE, PREGNANCY: hCG, Beta Chain, Quant, S: 192182 m[IU]/mL — ABNORMAL HIGH (ref <5)

## 2015-09-19 LAB — URINE MICROSCOPIC-ADD ON: RBC / HPF: NONE SEEN RBC/hpf (ref 0–5)

## 2015-09-19 LAB — POCT PREGNANCY, URINE: Preg Test, Ur: POSITIVE — AB

## 2015-09-19 MED ORDER — POLYETHYLENE GLYCOL 3350 17 GM/SCOOP PO POWD: 17.0000 g | Freq: Every day | ORAL | Status: DC | PRN

## 2015-09-19 MED ORDER — DOCUSATE SODIUM 100 MG PO CAPS: 100.0000 mg | ORAL_CAPSULE | Freq: Two times a day (BID) | ORAL | Status: DC | PRN

## 2015-09-19 NOTE — MAU Provider Note (Signed)
Chief Complaint: Abdominal Pain and Back Pain   None     SUBJECTIVE HPI: Victoria Orozco is a 25 y.o. G3P2002 at [redacted]w[redacted]d by LMP who presents to maternity admissions reporting onset of LLQ pain 2 days ago that is constant and sharp.  She has not tried anything for her pain, nothing makes it better or worse.  She also reports constipation during this pregnancy with bowel movements ever 2-3 days but discomfort with hard stools.  She denies vaginal bleeding, vaginal itching/burning, urinary symptoms, h/a, dizziness, n/v, or fever/chills.     HPI  Past Medical History  Diagnosis Date  . No pertinent past medical history   . Medical history non-contributory   . Gall stones    Past Surgical History  Procedure Laterality Date  . No past surgeries     Social History   Social History  . Marital Status: Single    Spouse Name: N/A  . Number of Children: N/A  . Years of Education: N/A   Occupational History  . Not on file.   Social History Main Topics  . Smoking status: Never Smoker   . Smokeless tobacco: Never Used  . Alcohol Use: No  . Drug Use: No  . Sexual Activity: Yes    Birth Control/ Protection: None     Comment: none in last 24 hours   Other Topics Concern  . Not on file   Social History Narrative   ** Merged History Encounter **       No current facility-administered medications on file prior to encounter.   No current outpatient prescriptions on file prior to encounter.   No Known Allergies  ROS:  Review of Systems  Constitutional: Negative for fever, chills and fatigue.  Respiratory: Negative for shortness of breath.   Cardiovascular: Negative for chest pain.  Gastrointestinal: Positive for abdominal pain.  Genitourinary: Positive for pelvic pain. Negative for dysuria, flank pain, vaginal bleeding, vaginal discharge, difficulty urinating and vaginal pain.  Neurological: Negative for dizziness and headaches.  Psychiatric/Behavioral: Negative.      I  have reviewed patient's Past Medical Hx, Surgical Hx, Family Hx, Social Hx, medications and allergies.   Physical Exam   Patient Vitals for the past 24 hrs:  BP Temp Pulse Resp  09/19/15 1831 140/76 mmHg - 80 18  09/19/15 1655 131/66 mmHg 98.1 F (36.7 C) 94 18   Constitutional: Well-developed, well-nourished female in no acute distress.  Cardiovascular: normal rate Respiratory: normal effort GI: Abd soft, non-tender. Pos BS x 4 MS: Extremities nontender, no edema, normal ROM Neurologic: Alert and oriented x 4.  GU: Neg CVAT.  PELVIC EXAM: Deferred  LAB RESULTS Results for orders placed or performed during the hospital encounter of 09/19/15 (from the past 24 hour(s))  Urinalysis, Routine w reflex microscopic (not at [facility])     Status: Abnormal   Collection Time: 09/19/15  4:45 PM  Result Value Ref Range   Color, Urine YELLOW YELLOW   APPearance CLOUDY (A) CLEAR   Specific Gravity, Urine >1.030 (H) 1.005 - 1.030   pH 6.0 5.0 - 8.0   Glucose, UA NEGATIVE NEGATIVE mg/dL   Hgb urine dipstick NEGATIVE NEGATIVE   Bilirubin Urine NEGATIVE NEGATIVE   Ketones, ur NEGATIVE NEGATIVE mg/dL   Protein, ur NEGATIVE NEGATIVE mg/dL   Nitrite POSITIVE (A) NEGATIVE   Leukocytes, UA SMALL (A) NEGATIVE  Urine microscopic-add on     Status: Abnormal   Collection Time: 09/19/15  4:45 PM  Result Value Ref Range  Squamous Epithelial / LPF 0-5 (A) NONE SEEN   WBC, UA 0-5 0 - 5 WBC/hpf   RBC / HPF NONE SEEN 0 - 5 RBC/hpf   Bacteria, UA MANY (A) NONE SEEN  Pregnancy, urine POC     Status: Abnormal   Collection Time: 09/19/15  4:52 PM  Result Value Ref Range   Preg Test, Ur POSITIVE (A) NEGATIVE  CBC     Status: Abnormal   Collection Time: 09/19/15  5:12 PM  Result Value Ref Range   WBC 9.6 4.0 - 10.5 K/uL   RBC 3.82 (L) 3.87 - 5.11 MIL/uL   Hemoglobin 11.6 (L) 12.0 - 15.0 g/dL   HCT 09.8 (L) 11.9 - 14.7 %   MCV 86.1 78.0 - 100.0 fL   MCH 30.4 26.0 - 34.0 pg   MCHC 35.3 30.0 - 36.0 g/dL    RDW 82.9 56.2 - 13.0 %   Platelets 306 150 - 400 K/uL  hCG, quantitative, pregnancy     Status: Abnormal   Collection Time: 09/19/15  5:12 PM  Result Value Ref Range   hCG, Beta Chain, Quant, S 865784 (H) <5 mIU/mL  ABO/Rh     Status: None   Collection Time: 09/19/15  5:12 PM  Result Value Ref Range   ABO/RH(D) A POS     --/--/A POS (02/16 1712)  IMAGING US Ob Comp Less 14 Wks  09/19/2015  CLINICAL DATA:  Pelvic pain EXAM: OBSTETRIC <14 WK ULTRASOUND TECHNIQUE: Transabdominal ultrasound was performed for evaluation of the gestation as well as the maternal uterus and adnexal regions. COMPARISON:  None. FINDINGS: Intrauterine gestational sac: Visualized/normal in shape. Yolk sac:  Absent Embryo:  Present Cardiac Activity: Present Heart Rate: 155 bpm CRL:   6.6 cm   12 w 6 d                  [place] EDC: 03/27/2016 Subchorionic hemorrhage:  None visualized. Maternal uterus/adnexae: 2.6 cm right ovarian cyst is noted. This may represent a corpus luteum cyst. No other focal abnormality is noted. No free fluid is seen. IMPRESSION: Single live intrauterine gestation at 12 weeks 6 days. It should be noted this study does not constitute a full fetal survey. It was performed for triage purposes at the request of the referring physician. Electronically Signed   By: Alcide Clever M.D.   On: 09/19/2015 18:08    MAU Management/MDM: Ordered labs and [place] and reviewed results.  Live [redacted]w[redacted]d IUP noted on today's [place].  No adnexal abnormality noted.  Likely source of pain is constipation.  Discussed dietary changes and Rx for Miralax and Colace.  Pt stable at time of discharge.  ASSESSMENT 1. Normal IUP (intrauterine pregnancy) on prenatal ultrasound, second trimester   2. Pelvic pain affecting pregnancy in first trimester, antepartum   3. Constipation, unspecified constipation type     PLAN Discharge home Miralax 17 g daily PRN Colace 100 mg BID prn Increase fiber/PO fluids, try probiotics including  yogurt    Follow-up Information    Schedule an appointment as soon as possible for a visit with Kathreen Cosier, MD.   Specialty:  Obstetrics and Gynecology   Why:  Return to MAU as needed for emergencies   Contact information:   [address] [place] [address] [phone]       Sharen Counter Certified Nurse-Midwife 09/19/2015  7:21 PM

## 2015-09-19 NOTE — Discharge Instructions (Signed)

## 2015-09-19 NOTE — MAU Note (Signed)
Pt presents to MAU with complaints of pain in her lower abdomen and back. Denies any vaginal bleeding or abnormal discharge. LMP 07/30/15

## 2015-09-20 LAB — HIV ANTIBODY (ROUTINE TESTING W REFLEX): HIV Screen 4th Generation wRfx: NONREACTIVE

## 2015-11-12 ENCOUNTER — Encounter (HOSPITAL_COMMUNITY): Payer: Self-pay

## 2015-11-12 ENCOUNTER — Emergency Department (HOSPITAL_COMMUNITY)
Admission: EM | Admit: 2015-11-12 | Discharge: 2015-11-12 | Disposition: A | Discharge status: Home / Self Care | Payer: No Typology Code available for payment source | Attending: Emergency Medicine | Admitting: Emergency Medicine

## 2015-11-12 DIAGNOSIS — Y92410 Unspecified street and highway as the place of occurrence of the external cause: Secondary | ICD-10-CM | POA: Insufficient documentation

## 2015-11-12 DIAGNOSIS — Z041 Encounter for examination and observation following transport accident: Secondary | ICD-10-CM | POA: Insufficient documentation

## 2015-11-12 DIAGNOSIS — Y9389 Activity, other specified: Secondary | ICD-10-CM | POA: Diagnosis not present

## 2015-11-12 DIAGNOSIS — O9A212 Injury, poisoning and certain other consequences of external causes complicating pregnancy, second trimester: Secondary | ICD-10-CM | POA: Insufficient documentation

## 2015-11-12 DIAGNOSIS — Y999 Unspecified external cause status: Secondary | ICD-10-CM | POA: Diagnosis not present

## 2015-11-12 DIAGNOSIS — Z3490 Encounter for supervision of normal pregnancy, unspecified, unspecified trimester: Secondary | ICD-10-CM

## 2015-11-12 DIAGNOSIS — Z3A20 20 weeks gestation of pregnancy: Secondary | ICD-10-CM | POA: Diagnosis not present

## 2015-11-12 DIAGNOSIS — Z8719 Personal history of other diseases of the digestive system: Secondary | ICD-10-CM | POA: Diagnosis not present

## 2015-11-12 NOTE — ED Notes (Signed)
Contacted Erin, [facility] RN. Pt is 20 weeks and 4 days. Advised to listen for fetal heart tones and check fetal heart rate.
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
## 2015-11-12 NOTE — ED Notes (Signed)
Pt stable, ambulatory, states understanding of discharge instructions 
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
## 2016-06-19 ENCOUNTER — Encounter (HOSPITAL_COMMUNITY): Payer: Self-pay | Admitting: Emergency Medicine

## 2016-06-19 ENCOUNTER — Emergency Department (HOSPITAL_COMMUNITY)
Admission: EM | Admit: 2016-06-19 | Discharge: 2016-06-19 | Disposition: A | Discharge status: Home / Self Care | Payer: Self-pay | Attending: Emergency Medicine | Admitting: Emergency Medicine

## 2016-06-19 DIAGNOSIS — R51 Headache: Secondary | ICD-10-CM | POA: Insufficient documentation

## 2016-06-19 DIAGNOSIS — R519 Headache, unspecified: Secondary | ICD-10-CM

## 2016-06-19 LAB — I-STAT CHEM 8, ED
BUN: 7 mg/dL (ref 6–20)
CALCIUM ION: 1.2 mmol/L (ref 1.15–1.40)
CHLORIDE: 106 mmol/L (ref 101–111)
Creatinine, Ser: 0.5 mg/dL (ref 0.44–1.00)
Glucose, Bld: 93 mg/dL (ref 65–99)
HEMATOCRIT: 37 % (ref 36.0–46.0)
Hemoglobin: 12.6 g/dL (ref 12.0–15.0)
Potassium: 3.8 mmol/L (ref 3.5–5.1)
SODIUM: 140 mmol/L (ref 135–145)
TCO2: 22 mmol/L (ref 0–100)

## 2016-06-19 LAB — CBC
HCT: 36 % (ref 36.0–46.0)
HEMOGLOBIN: 12.2 g/dL (ref 12.0–15.0)
MCH: 27.8 pg (ref 26.0–34.0)
MCHC: 33.9 g/dL (ref 30.0–36.0)
MCV: 82 fL (ref 78.0–100.0)
PLATELETS: 343 10*3/uL (ref 150–400)
RBC: 4.39 MIL/uL (ref 3.87–5.11)
RDW: 14.9 % (ref 11.5–15.5)
WBC: 8.7 10*3/uL (ref 4.0–10.5)

## 2016-06-19 MED ORDER — SODIUM CHLORIDE 0.9 % IV BOLUS (SEPSIS)
1000.0000 mL | Freq: Once | INTRAVENOUS | Status: AC
  Administered 2016-06-19: 1000 mL via INTRAVENOUS

## 2016-06-19 MED ORDER — KETOROLAC TROMETHAMINE 30 MG/ML IJ SOLN
30.0000 mg | Freq: Once | INTRAMUSCULAR | Status: AC
  Administered 2016-06-19: 30 mg via INTRAVENOUS
  Filled 2016-06-19: qty 1

## 2016-06-19 MED ORDER — ONDANSETRON HCL 4 MG/2ML IJ SOLN
4.0000 mg | Freq: Once | INTRAMUSCULAR | Status: AC
  Administered 2016-06-19: 4 mg via INTRAVENOUS
  Filled 2016-06-19: qty 2

## 2016-06-19 MED ORDER — DIPHENHYDRAMINE HCL 50 MG/ML IJ SOLN
25.0000 mg | Freq: Once | INTRAMUSCULAR | Status: AC
  Administered 2016-06-19: 25 mg via INTRAVENOUS
  Filled 2016-06-19: qty 1

## 2016-06-19 NOTE — ED Triage Notes (Signed)
Patient with headache for the last three days.  She states that she does have some nausea, no vomiting.  Patient states that the pain increases with talking and laying down.  She states that the pain is at the crown of her head and goes down into her neck.  She is CAOx4, GCS 15.  Patient does have some noise sensitivity.

## 2016-06-19 NOTE — ED Provider Notes (Signed)
[facility]-EMERGENCY DEPT Provider Note   CSN: [id number] Arrival date & time: 06/19/16  0013  History   Chief Complaint Chief Complaint  Patient presents with  . Migraine    HPI Victoria Orozco is a 25 y.o. female.  HPI   Patient has as PMH of gall stones presents to the ER with complaints of headache for the past three days. She has had associated nausea without vomiting. The pain worsens she she talks and lays down, she describes the pain starting at the top of the head and down the midline towards her beck. She has not had any fevers, neck pain, N/V/D, weakness or confusion. She has had some sensitivity to noise. Denies photophobia. + sore throat and right ear pain    Past Medical History:  Diagnosis Date  . Gall stones   . Medical history non-contributory   . No pertinent past medical history     There are no active problems to display for this patient.   Past Surgical History:  Procedure Laterality Date  . NO PAST SURGERIES      OB History    Gravida Para Term Preterm AB Living   3 2 2  0 0 2   SAB TAB Ectopic Multiple Live Births   0 0 0   2     Home Medications    Prior to Admission medications   Medication Sig Start Date End Date Taking? Authorizing Provider  Prenatal Vit-Fe Fumarate-FA (PRENATAL PO) Take 1 tablet by mouth daily.    Historical Provider, MD    Family History Family History  Problem Relation Age of Onset  . Heart disease Mother   . Diabetes Mother   . Hyperlipidemia Mother   . Mental retardation Father     downs syndrome  . Anesthesia problems Neg Hx   . Hypotension Neg Hx   . Malignant hyperthermia Neg Hx   . Pseudochol deficiency Neg Hx     Social History Social History  Substance Use Topics  . Smoking status: Never Smoker  . Smokeless tobacco: Never Used  . Alcohol use No   Allergies   Patient has no known allergies.   Review of Systems Review of Systems Review of Systems All other systems negative except as  documented in the HPI. All pertinent positives and negatives as reviewed in the HPI.   Physical Exam Updated Vital Signs BP 145/95 (BP Location: Left Arm)   Pulse 70   Temp 98.2 F (36.8 C) (Oral)   Resp 16   Ht 5\' 6"  (1.676 m)   Wt 99.8 kg   LMP 06/16/2016 (Exact Date)   SpO2 100%   Breastfeeding? No   BMI 35.51 kg/m   Physical Exam  Constitutional: She is oriented to person, place, and time. She appears well-developed and well-nourished. No distress.  HENT:  Head: Normocephalic and atraumatic.  Right Ear: Tympanic membrane and ear canal normal.  Left Ear: Tympanic membrane and ear canal normal.  Nose: Nose normal.  Mouth/Throat: Uvula is midline and oropharynx is clear and moist.  Eyes: Conjunctivae, EOM and lids are normal. Pupils are equal, round, and reactive to light.  Neck: Normal range of motion. Neck supple. No spinous process tenderness and no muscular tenderness present. No neck rigidity. Normal range of motion present. No Brudzinski's sign and no Kernig's sign noted.  Cardiovascular: Normal rate and regular rhythm.   Pulmonary/Chest: Effort normal.  Abdominal: Soft.  Neurological: She is alert and oriented to person, place, and time.  Cranial  nerves grossly intact on exam. Pt alert and oriented x 3 Upper and lower extremity strength is symmetrical and physiologic Normal muscular tone No facial droop Coordination intact, no limb ataxia,No pronator drift  Skin: Skin is warm and dry.  Nursing note and vitals reviewed.    ED Treatments / Results  Labs (all labs ordered are listed, but only abnormal results are displayed) Labs Reviewed  CBC  I-STAT CHEM 8, ED    EKG  EKG Interpretation None       Radiology No results found.  Procedures Procedures (including critical care time)  Medications Ordered in ED Medications  sodium chloride 0.9 % bolus 1,000 mL (0 mLs Intravenous Stopped 06/19/16 0446)  diphenhydrAMINE (BENADRYL) injection 25 mg (25 mg  Intravenous Given 06/19/16 0402)  ondansetron (ZOFRAN) injection 4 mg (4 mg Intravenous Given 06/19/16 0401)  ketorolac (TORADOL) 30 MG/ML injection 30 mg (30 mg Intravenous Given 06/19/16 0446)     Initial Impression / Assessment and Plan / ED Course  I have reviewed the triage vital signs and the nursing notes.  Pertinent labs & imaging results that were available during my care of the patient were reviewed by me and considered in my medical decision making (see chart for details).  Clinical Course     Pt HA treated and improved while in ED.  Presentation is like pts typical HA and non concerning for [facility], ICH, Meningitis, or temporal arteritis. Pt is afebrile with no focal neuro deficits, nuchal rigidity, or change in vision. Pt is to follow up with PCP to discuss prophylactic medication. Pt verbalizes understanding and is agreeable with plan to dc.    Final Clinical Impressions(s) / ED Diagnoses   Final diagnoses:  Nonintractable headache, unspecified chronicity pattern, unspecified headache type    New Prescriptions New Prescriptions   No medications on file     Marlon Peliffany Gemayel Mascio, PA-C 06/19/16 0454    Glynn OctaveStephen Rancour, MD 06/19/16 [id number]

## 2016-06-19 NOTE — ED Notes (Signed)
Patient stated that her head was hurting worst...Victoria Orozco

## 2016-10-05 ENCOUNTER — Emergency Department (HOSPITAL_COMMUNITY)
Admission: EM | Admit: 2016-10-05 | Discharge: 2016-10-05 | Disposition: A | Discharge status: Home / Self Care | Payer: Self-pay | Attending: Emergency Medicine | Admitting: Emergency Medicine

## 2016-10-05 ENCOUNTER — Encounter (HOSPITAL_COMMUNITY): Payer: Self-pay | Admitting: Nurse Practitioner

## 2016-10-05 DIAGNOSIS — N898 Other specified noninflammatory disorders of vagina: Secondary | ICD-10-CM | POA: Insufficient documentation

## 2016-10-05 LAB — URINALYSIS, MICROSCOPIC (REFLEX): RBC / HPF: NONE SEEN RBC/hpf (ref 0–5)

## 2016-10-05 LAB — WET PREP, GENITAL
Clue Cells Wet Prep HPF POC: NONE SEEN
Sperm: NONE SEEN
Trich, Wet Prep: NONE SEEN
YEAST WET PREP: NONE SEEN

## 2016-10-05 LAB — URINALYSIS, ROUTINE W REFLEX MICROSCOPIC
Bilirubin Urine: NEGATIVE
Glucose, UA: NEGATIVE mg/dL
HGB URINE DIPSTICK: NEGATIVE
KETONES UR: NEGATIVE mg/dL
Nitrite: POSITIVE — AB
PROTEIN: NEGATIVE mg/dL
Specific Gravity, Urine: 1.025 (ref 1.005–1.030)
pH: 6 (ref 5.0–8.0)

## 2016-10-05 MED ORDER — FLUCONAZOLE 100 MG PO TABS
150.0000 mg | ORAL_TABLET | Freq: Once | ORAL | Status: AC
  Administered 2016-10-05: 150 mg via ORAL
  Filled 2016-10-05: qty 2

## 2016-10-05 NOTE — ED Triage Notes (Signed)
Pt presents with c/o vaginal itching. The itching began 2 days ago. She reports dysuria. She denies pain, urinary frequency, vaginal discharge, vaginal bleeding. She has not used any new soaps, detergents or other products and has not tried anything for the itching at home.

## 2016-10-05 NOTE — ED Provider Notes (Signed)
[facility] DEPT Provider Note   CSN: [id number] Arrival date & time: 10/05/16  1707     History   Chief Complaint Chief Complaint  Patient presents with  . Vaginal Itching    HPI Victoria Orozco is a 26 y.o. female. Presents with a CC of vaginal itching.  HPI;  Sx for 3 days, white Dc, no dysuria, no dyspareunia. Denies pregnancy. No VB  Past Medical History:  Diagnosis Date  . Gall stones   . Medical history non-contributory   . No pertinent past medical history     There are no active problems to display for this patient.   Past Surgical History:  Procedure Laterality Date  . NO PAST SURGERIES      OB History    Gravida Para Term Preterm AB Living   3 2 2  0 0 2   SAB TAB Ectopic Multiple Live Births   0 0 0   2       Home Medications    Prior to Admission medications   Not on File    Family History Family History  Problem Relation Age of Onset  . Heart disease Mother   . Diabetes Mother   . Hyperlipidemia Mother   . Mental retardation Father     downs syndrome  . Anesthesia problems Neg Hx   . Hypotension Neg Hx   . Malignant hyperthermia Neg Hx   . Pseudochol deficiency Neg Hx     Social History Social History  Substance Use Topics  . Smoking status: Never Smoker  . Smokeless tobacco: Never Used  . Alcohol use No     Allergies   Patient has no known allergies.   Review of Systems Review of Systems  Constitutional: Negative for appetite change, chills, diaphoresis, fatigue and fever.  HENT: Negative for mouth sores, sore throat and trouble swallowing.   Eyes: Negative for visual disturbance.  Respiratory: Negative for cough, chest tightness, shortness of breath and wheezing.   Cardiovascular: Negative for chest pain.  Gastrointestinal: Negative for abdominal distention, abdominal pain, diarrhea, nausea and vomiting.  Endocrine: Negative for polydipsia, polyphagia and polyuria.  Genitourinary: Negative for dysuria,  frequency and hematuria.  Musculoskeletal: Negative for gait problem.  Skin: Negative for color change, pallor and rash.  Neurological: Negative for dizziness, syncope, light-headedness and headaches.  Hematological: Does not bruise/bleed easily.  Psychiatric/Behavioral: Negative for behavioral problems and confusion.     Physical Exam Updated Vital Signs BP 126/77   Pulse 75   Temp 98.7 F (37.1 C) (Oral)   Resp 16   LMP 07/14/2016   SpO2 98%   Physical Exam  Constitutional: She is oriented to person, place, and time. She appears well-developed and well-nourished. No distress.  HENT:  Head: Normocephalic.  Eyes: Conjunctivae are normal. Pupils are equal, round, and reactive to light. No scleral icterus.  Neck: Normal range of motion. Neck supple. No thyromegaly present.  Cardiovascular: Normal rate and regular rhythm.  Exam reveals no gallop and no friction rub.   No murmur heard. Pulmonary/Chest: Effort normal and breath sounds normal. No respiratory distress. She has no wheezes. She has no rales.  Abdominal: Soft. Bowel sounds are normal. She exhibits no distension. There is no tenderness. There is no rebound.  Genitourinary:     Musculoskeletal: Normal range of motion.  Neurological: She is alert and oriented to person, place, and time.  Skin: Skin is warm and dry. No rash noted.  Psychiatric: She has a normal mood and affect.  Her behavior is normal.     ED Treatments / Results  Labs (all labs ordered are listed, but only abnormal results are displayed) Labs Reviewed  WET PREP, GENITAL - Abnormal; Notable for the following:       Result Value   WBC, Wet Prep HPF POC MANY (*)    All other components within normal limits  URINALYSIS, ROUTINE W REFLEX MICROSCOPIC - Abnormal; Notable for the following:    APPearance HAZY (*)    Nitrite POSITIVE (*)    Leukocytes, UA SMALL (*)    All other components within normal limits  URINALYSIS, MICROSCOPIC (REFLEX) -  Abnormal; Notable for the following:    Bacteria, UA MANY (*)    Squamous Epithelial / LPF 6-30 (*)    All other components within normal limits  POC URINE PREG, ED  GC/CHLAMYDIA PROBE AMP ([place]) NOT AT [facility]    EKG  EKG Interpretation None       Radiology No results found.  Procedures Procedures (including critical care time)  Medications Ordered in ED Medications  fluconazole (DIFLUCAN) tablet 150 mg (not administered)     Initial Impression / Assessment and Plan / ED Course  I have reviewed the triage vital signs and the nursing notes.  Pertinent labs & imaging results that were available during my care of the patient were reviewed by me and considered in my medical decision making (see chart for details).     Wet Prep negative, HCG negative. Exam highly suggestive of Yeast Vaginitis. Will treat empirically with single dose diflucan.  Final Clinical Impressions(s) / ED Diagnoses   Final diagnoses:  Vaginal itching    New Prescriptions New Prescriptions   No medications on file     Rolland PorterMark Walida Cajas, MD 10/05/16 1925

## 2016-10-05 NOTE — ED Notes (Signed)
Pt ambulated to room from waiting room, tolerated well. 

## 2016-10-06 LAB — GC/CHLAMYDIA PROBE AMP (~~LOC~~) NOT AT ARMC
CHLAMYDIA, DNA PROBE: NEGATIVE
NEISSERIA GONORRHEA: NEGATIVE

## 2017-01-03 ENCOUNTER — Encounter (HOSPITAL_COMMUNITY): Payer: Self-pay | Admitting: Emergency Medicine

## 2017-01-03 ENCOUNTER — Emergency Department (HOSPITAL_COMMUNITY)
Admission: EM | Admit: 2017-01-03 | Discharge: 2017-01-03 | Disposition: A | Discharge status: Home / Self Care | Payer: Self-pay | Attending: Emergency Medicine | Admitting: Emergency Medicine

## 2017-01-03 ENCOUNTER — Emergency Department (HOSPITAL_COMMUNITY): Payer: Self-pay

## 2017-01-03 DIAGNOSIS — R109 Unspecified abdominal pain: Secondary | ICD-10-CM

## 2017-01-03 DIAGNOSIS — N644 Mastodynia: Secondary | ICD-10-CM

## 2017-01-03 DIAGNOSIS — Z3A09 9 weeks gestation of pregnancy: Secondary | ICD-10-CM | POA: Insufficient documentation

## 2017-01-03 DIAGNOSIS — O26891 Other specified pregnancy related conditions, first trimester: Secondary | ICD-10-CM | POA: Insufficient documentation

## 2017-01-03 LAB — URINALYSIS, ROUTINE W REFLEX MICROSCOPIC
BILIRUBIN URINE: NEGATIVE
Glucose, UA: NEGATIVE mg/dL
Hgb urine dipstick: NEGATIVE
KETONES UR: NEGATIVE mg/dL
NITRITE: POSITIVE — AB
PH: 6 (ref 5.0–8.0)
PROTEIN: NEGATIVE mg/dL
Specific Gravity, Urine: 1.017 (ref 1.005–1.030)

## 2017-01-03 LAB — WET PREP, GENITAL
Clue Cells Wet Prep HPF POC: NONE SEEN
SPERM: NONE SEEN
Trich, Wet Prep: NONE SEEN

## 2017-01-03 LAB — I-STAT BETA HCG BLOOD, ED (MC, WL, AP ONLY): I-stat hCG, quantitative: >2000 m[IU]/mL — ABNORMAL HIGH (ref <5)

## 2017-01-03 LAB — POC URINE PREG, ED: Preg Test, Ur: POSITIVE — AB

## 2017-01-03 NOTE — ED Notes (Signed)
Pt reports left breast pain for 3 months. Stating the pain is behind her breast and it comes and goes. Pt reports some SOB, none at this time. Pt denies hx of blood clot.

## 2017-01-03 NOTE — ED Notes (Signed)
POC Urine-Positive RN Fredric MareBailey informed

## 2017-01-03 NOTE — Discharge Instructions (Signed)
Your ultrasound tonight shows that you are [redacted] weeks pregnant. Start your prenatal care and prenatal vitamins.

## 2017-01-03 NOTE — ED Notes (Signed)
Pt stable, ambulatory, states understanding of discharge instructions 

## 2017-01-03 NOTE — ED Provider Notes (Signed)
[facility] Provider Note    By signing my name below, I, Earmon Phoenix, attest that this documentation has been prepared under the direction and in the presence of [facility], [place]. Electronically Signed: Earmon Phoenix, ED Scribe. 01/03/17. 10:28 PM.    History   Chief Complaint Chief Complaint  Patient presents with  . Breast Pain   The history is provided by the patient and medical records. No language interpreter was used.    Victoria Orozco is an obese 26 y.o. female who presents to the Emergency Department complaining of intermittent, sharp bilateral breast pain that began three months ago. She reports associated intermittent abdominal pain and nausea for the last two months. She reports one instance of bloody discharge from the nipples. She has not taken anything for pain. There are no modifying factors noted. She denies fever, chills, vomiting, urinary complaints. She delivered her last child 8 months ago in [place]. She is not currently on any birth control. She has not had sex in the last 5 months and denies possibility of pregnancy. She states her periods have been irregular since giving birth 8 months ago. She has three children at home and reports one miscarriage.   Past Medical History:  Diagnosis Date  . Gall stones   . Medical history non-contributory   . No pertinent past medical history     There are no active problems to display for this patient.   Past Surgical History:  Procedure Laterality Date  . NO PAST SURGERIES        Home Medications    Prior to Admission medications   Not on File    Family History Family History  Problem Relation Age of Onset  . Heart disease Mother   . Diabetes Mother   . Hyperlipidemia Mother   . Mental retardation Father        downs syndrome  . Anesthesia problems Neg Hx   . Hypotension Neg Hx   . Malignant hyperthermia Neg Hx   . Pseudochol deficiency Neg Hx     Social History Social  History  Substance Use Topics  . Smoking status: Never Smoker  . Smokeless tobacco: Never Used  . Alcohol use No     Allergies   Patient has no known allergies.   Review of Systems Review of Systems  Constitutional: Negative for chills and fever.  Cardiovascular:       Breast tenderness  Gastrointestinal: Positive for abdominal pain (occasional cramping) and nausea. Negative for vomiting.  Genitourinary: Negative for difficulty urinating, dysuria, frequency, hematuria and vaginal bleeding.  Musculoskeletal: Negative for back pain.  Skin: Negative for wound.  Hematological: Negative for adenopathy.     Physical Exam Updated Vital Signs BP 134/74 (BP Location: Left Arm)   Pulse 70   Temp 98.8 F (37.1 C) (Oral)   Resp 18   Ht 5\' 4"  (1.626 m)   Wt 220 lb (99.8 kg)   LMP 08/03/2016 (Approximate) Comment: Reports no period for 6 months  SpO2 99%   BMI 37.76 kg/m   Physical Exam  Constitutional: She is oriented to person, place, and time. She appears well-developed and well-nourished. No distress.  HENT:  Head: Normocephalic and atraumatic.  Eyes: EOM are normal.  Neck: Neck supple.  Cardiovascular: Normal rate and regular rhythm.   Pulmonary/Chest: Effort normal and breath sounds normal. She exhibits no mass. Right breast exhibits no inverted nipple, no mass, no nipple discharge, no skin change and no tenderness. Left breast exhibits  no inverted nipple, no mass, no nipple discharge, no skin change and no tenderness. There is no breast swelling.  I am unable to reproduce on exam the tenderness that the patient has described.  Abdominal: Soft. Bowel sounds are normal. There is no tenderness. There is no CVA tenderness.  Genitourinary: Pelvic exam was performed with patient supine. Cervix exhibits no motion tenderness. Right adnexum displays no tenderness. Left adnexum displays no tenderness. Vaginal discharge found.  Genitourinary Comments: External genitalia without  lesions. Yellow discharge in vaginal vault. Inflamed cervix. Cervical os closed. No CMT. No adnexal tenderness. Unable to determine size of uterus due to body habitus. Exam chaperoned by female scribe.  Musculoskeletal: Normal range of motion.  Lymphadenopathy:  No axillary node tenderness.  Neurological: She is alert and oriented to person, place, and time. No cranial nerve deficit.  Skin: Skin is warm and dry.  Psychiatric: She has a normal mood and affect.  Nursing note and vitals reviewed.    ED Treatments / Results  DIAGNOSTIC STUDIES: Oxygen Saturation is 99% on RA, normal by my interpretation.   COORDINATION OF CARE: 7:16 PM- Will check urine pregnancy test. Pt verbalizes understanding and agrees to plan.  8:00 PM- Will order istat beta hcg due to positive pregnancy test.  9:36 PM- Will order ultrasound since pt's hcg > 2000.  Medications - No data to display  Labs (all labs ordered are listed, but only abnormal results are displayed) Labs Reviewed  WET PREP, GENITAL - Abnormal; Notable for the following:       Result Value   Yeast Wet Prep HPF POC PRESENT (*)    WBC, Wet Prep HPF POC MANY (*)    All other components within normal limits  URINALYSIS, ROUTINE W REFLEX MICROSCOPIC - Abnormal; Notable for the following:    APPearance HAZY (*)    Nitrite POSITIVE (*)    Leukocytes, UA LARGE (*)    Bacteria, UA MANY (*)    Squamous Epithelial / LPF 0-5 (*)    All other components within normal limits  POC URINE PREG, ED - Abnormal; Notable for the following:    Preg Test, Ur POSITIVE (*)    All other components within normal limits  I-STAT BETA HCG BLOOD, ED ([facility], [facility], [facility] ONLY) - Abnormal; Notable for the following:    I-stat hCG, quantitative >2,000.0 (*)    All other components within normal limits  URINE CULTURE  GC/CHLAMYDIA PROBE AMP () NOT AT [facility]    Radiology US Ob Comp Less 14 Wks  Result Date: 01/03/2017 CLINICAL DATA:  Abdominal pain during  pregnancy. Beta HCG greater than 2000. EXAM: OBSTETRIC <14 WK [place] AND TRANSVAGINAL OB US TECHNIQUE: Both transabdominal and transvaginal ultrasound examinations were performed for complete evaluation of the gestation as well as the maternal uterus, adnexal regions, and pelvic cul-de-sac. Transvaginal technique was performed to assess early pregnancy. COMPARISON:  None. FINDINGS: Intrauterine gestational sac: Single Yolk sac:  Visualize Embryo:  Visualize Cardiac Activity: Visualize Heart Rate: 163  bpm CRL:  23  mm   9 w   0 d                  [place] EDC: 08/08/2017 Subchorionic hemorrhage:  None visualized. Maternal uterus/adnexae: Normal IMPRESSION: Single live intrauterine gestation at 9 weeks 0 days. Ultrasound [facility] 08/08/2017. No findings for the patient's pain. Electronically Signed   By: Tollie Eth M.D.   On: 01/03/2017 22:22   US Ob Transvaginal  Result  Date: 01/03/2017 CLINICAL DATA:  Abdominal pain during pregnancy. Beta HCG greater than 2000. EXAM: OBSTETRIC <14 WK US AND TRANSVAGINAL OB US TECHNIQUE: Both transabdominal and transvaginal ultrasound examinations were performed for complete evaluation of the gestation as well as the maternal uterus, adnexal regions, and pelvic cul-de-sac. Transvaginal technique was performed to assess early pregnancy. COMPARISON:  None. FINDINGS: Intrauterine gestational sac: Single Yolk sac:  Visualize Embryo:  Visualize Cardiac Activity: Visualize Heart Rate: 163  bpm CRL:  23  mm   9 w   0 d                  US EDC: 08/08/2017 Subchorionic hemorrhage:  None visualized. Maternal uterus/adnexae: Normal IMPRESSION: Single live intrauterine gestation at 9 weeks 0 days. [facility]. No findings for the patient's pain. Electronically Signed   By: Tollie Ethavid  Kwon M.D.   On: 01/03/2017 22:22    Procedures Pelvic exam Date/Time: 01/03/2017 8:20 PM Performed by: Janne NapoleonNEESE, HOPE M Authorized by: Janne NapoleonNEESE, HOPE M  Consent: Verbal consent obtained. Risks and benefits: risks,  benefits and alternatives were discussed Consent given by: patient Patient understanding: patient states understanding of the procedure being performed Patient consent: the patient's understanding of the procedure matches consent given Relevant documents: relevant documents present and verified Test results: test results available and properly labeled Required items: required blood products, implants, devices, and special equipment available Patient identity confirmed: verbally with patient Local anesthesia used: no  Anesthesia: Local anesthesia used: no  Sedation: Patient sedated: no Patient tolerance: Patient tolerated the procedure well with no immediate complications    (including critical care time)  Medications Ordered in ED Medications - No data to display   Initial Impression / Assessment and Plan / ED Course  I have reviewed the triage vital signs and the nursing notes.  Pertinent labs & imaging results that were available during my care of the patient were reviewed by me and considered in my medical decision making (see chart for details).   Final Clinical Impressions(s) / ED Diagnoses  26 y.o. female with breast tenderness stable for d/c with viable 9 week IUP. Instructed patient to start prenatal care and prenatal vitamins. She voices understanding and will call her OB in [place] to schedule an appointment.  Final diagnoses:  Breast tenderness in female  Abdominal pain during pregnancy, first trimester    New Prescriptions There are no discharge medications for this patient. I personally performed the services described in this documentation, which was scribed in my presence. The recorded information has been reviewed and is accurate.    Kerrie Buffaloeese, Hope PattersonM, [place]NP 01/04/17 0145    Gerhard MunchLockwood, Robert, MD 01/06/17 1229

## 2017-01-03 NOTE — ED Notes (Signed)
ED Provider at bedside. 

## 2017-01-03 NOTE — ED Triage Notes (Signed)
Pt to ER for bilateral breast pain x3 months. Described as burning and intermittent. No other complaints.

## 2017-01-04 LAB — GC/CHLAMYDIA PROBE AMP (~~LOC~~) NOT AT ARMC
Chlamydia: NEGATIVE
Neisseria Gonorrhea: NEGATIVE

## 2017-05-08 ENCOUNTER — Encounter (HOSPITAL_COMMUNITY): Payer: Self-pay | Admitting: *Deleted

## 2017-05-08 ENCOUNTER — Inpatient Hospital Stay (HOSPITAL_COMMUNITY)
Admission: AD | Admit: 2017-05-08 | Discharge: 2017-05-08 | Disposition: A | Discharge status: Home / Self Care | Payer: Self-pay | Source: Ambulatory Visit | Attending: Obstetrics & Gynecology | Admitting: Obstetrics & Gynecology

## 2017-05-08 DIAGNOSIS — O9989 Other specified diseases and conditions complicating pregnancy, childbirth and the puerperium: Secondary | ICD-10-CM

## 2017-05-08 DIAGNOSIS — O99891 Other specified diseases and conditions complicating pregnancy: Secondary | ICD-10-CM

## 2017-05-08 DIAGNOSIS — O2312 Infections of bladder in pregnancy, second trimester: Secondary | ICD-10-CM | POA: Insufficient documentation

## 2017-05-08 DIAGNOSIS — N3001 Acute cystitis with hematuria: Secondary | ICD-10-CM

## 2017-05-08 DIAGNOSIS — Z3A27 27 weeks gestation of pregnancy: Secondary | ICD-10-CM | POA: Insufficient documentation

## 2017-05-08 DIAGNOSIS — M549 Dorsalgia, unspecified: Secondary | ICD-10-CM

## 2017-05-08 LAB — URINALYSIS, ROUTINE W REFLEX MICROSCOPIC
Bilirubin Urine: NEGATIVE
Glucose, UA: NEGATIVE mg/dL
Hgb urine dipstick: NEGATIVE
Ketones, ur: NEGATIVE mg/dL
NITRITE: POSITIVE — AB
PROTEIN: NEGATIVE mg/dL
SPECIFIC GRAVITY, URINE: 1.016 (ref 1.005–1.030)
pH: 6 (ref 5.0–8.0)

## 2017-05-08 LAB — FETAL FIBRONECTIN: FETAL FIBRONECTIN: NEGATIVE

## 2017-05-08 MED ORDER — CEPHALEXIN 500 MG PO CAPS: 500.0000 mg | ORAL_CAPSULE | Freq: Four times a day (QID) | ORAL | 0 refills | Status: DC

## 2017-05-08 NOTE — Discharge Instructions (Signed)

## 2017-05-08 NOTE — MAU Note (Addendum)
Having pains in lower stomach that go around to my back. Denies LOF or bleeding. Pain is crampy and intermittent. I get headaches almost everyday. Tylenol helps but when it wears off h/a back again

## 2017-05-08 NOTE — MAU Provider Note (Signed)
Chief Complaint:  Back Pain   First Provider Initiated Contact with Patient 05/08/17 2219     HPI: Victoria Orozco is a 26 y.o. [id number] at 39w6dwho presents to maternity admissions reporting lower abdominal pain.  Wraps around to back.  Has been crampy and comes and goes.  Also having headaches. She reports good fetal movement, denies LOF, vaginal bleeding, vaginal itching/burning, urinary symptoms, dizziness, n/v, diarrhea, constipation or fever/chills.    Back Pain  This is a new problem. The current episode started today. The problem occurs intermittently. The problem is unchanged. The pain is present in the lumbar spine. The pain does not radiate. Associated symptoms include abdominal pain and pelvic pain. Pertinent negatives include no dysuria, fever, headaches, numbness, paresis, tingling or weakness. She has tried nothing for the symptoms.    RN Note: Having pains in lower stomach that go around to my back. Denies LOF or bleeding. Pain is crampy and intermittent. I get headaches almost everyday. Tylenol helps but when it wears off h/a back again  Past Medical History: Past Medical History:  Diagnosis Date  . Gall stones   . Medical history non-contributory   . No pertinent past medical history     Past obstetric history: OB History  Gravida Para Term Preterm AB Living  0 2  SAB TAB Ectopic Multiple Live Births  0 0 0   2    # Outcome Date GA Lbr Len/2nd Weight Sex Delivery Anes PTL Lv  4 Current           3 Term 04/19/14 [redacted]w[redacted]d   F Vag-Spont   LIV  2 Term 09/17/11 [redacted]w[redacted]d 03:30 / 00:40 6 lb 11.8 oz (3.055 kg) M Vag-Spont EPI  LIV     Birth Comments: WNL  1 Preterm               Past Surgical History: Past Surgical History:  Procedure Laterality Date  . CHOLECYSTECTOMY  2016  . NO PAST SURGERIES      Family History: Family History  Problem Relation Age of Onset  . Heart disease Mother   . Diabetes Mother   . Hyperlipidemia Mother   . Mental  retardation Father        downs syndrome  . Anesthesia problems Neg Hx   . Hypotension Neg Hx   . Malignant hyperthermia Neg Hx   . Pseudochol deficiency Neg Hx     Social History: Social History  Substance Use Topics  . Smoking status: Never Smoker  . Smokeless tobacco: Never Used  . Alcohol use No    Allergies: No Known Allergies  Meds:  Prescriptions Prior to Admission  Medication Sig Dispense Refill Last Dose  . Prenatal Vit-Fe Fumarate-FA (MULTIVITAMIN-PRENATAL) 27-0.8 MG TABS tablet Take 1 tablet by mouth daily at 12 noon.   05/08/2017 at Unknown time    I have reviewed patient's Past Medical Hx, Surgical Hx, Family Hx, Social Hx, medications and allergies.   ROS:  Review of Systems  Constitutional: Negative for fever.  Gastrointestinal: Positive for abdominal pain.  Genitourinary: Positive for pelvic pain. Negative for dysuria.  Musculoskeletal: Positive for back pain.  Neurological: Negative for tingling, weakness, numbness and headaches.   Other systems negative  Physical Exam  Patient Vitals for the past 24 hrs:  BP Temp Pulse Resp Height Weight  05/08/17 2137 (!) 131/58 98.3 F (36.8 C) 67 18  (1.626 m) 242 lb (109.8 kg)   Constitutional: Well-developed, well-nourished female  in no acute distress.  Cardiovascular: normal rate and rhythm Respiratory: normal effort, clear to auscultation bilaterally GI: Abd soft, non-tender, gravid appropriate for gestational age.   No rebound or guarding. MS: Extremities nontender, no edema, normal ROM Neurologic: Alert and oriented x 4.  GU: Neg CVAT.  PELVIC EXAM: Dilation: Closed Effacement (%): Thick Cervical Position: Posterior Exam by:: [name], CNM   FHT:  Baseline 140 , moderate variability, accelerations present, no decelerations Contractions:   Rare   Labs:    Results for orders placed or performed during the hospital encounter of 05/08/17 (from the past 24 hour(s))  Urinalysis, Routine w  reflex microscopic     Status: Abnormal   Collection Time: 05/08/17  9:45 PM  Result Value Ref Range   Color, Urine YELLOW YELLOW   APPearance HAZY (A) CLEAR   Specific Gravity, Urine 1.016 1.005 - 1.030   pH 6.0 5.0 - 8.0   Glucose, UA NEGATIVE NEGATIVE mg/dL   Hgb urine dipstick NEGATIVE NEGATIVE   Bilirubin Urine NEGATIVE NEGATIVE   Ketones, ur NEGATIVE NEGATIVE mg/dL   Protein, ur NEGATIVE NEGATIVE mg/dL   Nitrite POSITIVE (A) NEGATIVE   Leukocytes, UA LARGE (A) NEGATIVE   RBC / HPF 0-5 0 - 5 RBC/hpf   WBC, UA 6-30 0 - 5 WBC/hpf   Bacteria, UA RARE (A) NONE SEEN   Squamous Epithelial / LPF 6-30 (A) NONE SEEN   Mucus PRESENT   Fetal fibronectin     Status: None   Collection Time: 05/08/17 10:25 PM  Result Value Ref Range   Fetal Fibronectin NEGATIVE NEGATIVE     Imaging:  No results found.  MAU Course/MDM: I have ordered labs and reviewed results.   Fetal fibronectin is negative as expected.  No contractions noted on EFM.  UA is suggestive of UTI, however  Will culture urine and treat presumptively NST reviewed and found to be reactive, Category I   Evaluation was focused on diagnosis of possible preterm labor which can be life-threatening for a fetus of this gestational age.   UTI in pregnancy can lead to pyelonephritis  Assessment: SIUP at [redacted]w[redacted]d Acute cystitis with hematuria - Plan: Discharge patient  Back pain affecting pregnancy in second trimester - Plan: Discharge patient   Plan: Discharge home Urine to culture Rx Keflex for presumed UTI Pyelonephritis precautions reviewed in detail Preterm Labor precautions and fetal kick counts Follow up in Office for prenatal visits and recheck of status  Encouraged to return here or to other Urgent Care/ED if she develops worsening of symptoms, increase in pain, fever, or other concerning symptoms.   Pt stable at time of discharge.  [name] CNM, MSN Certified Nurse-Midwife 05/08/2017 10:19 PM

## 2017-05-11 LAB — CULTURE, OB URINE

## 2017-10-31 ENCOUNTER — Encounter (HOSPITAL_COMMUNITY): Payer: Self-pay

## 2017-12-11 ENCOUNTER — Emergency Department (HOSPITAL_COMMUNITY)
Admission: EM | Admit: 2017-12-11 | Discharge: 2017-12-11 | Disposition: A | Discharge status: Home / Self Care | Payer: Self-pay | Attending: Physician Assistant | Admitting: Physician Assistant

## 2017-12-11 ENCOUNTER — Encounter (HOSPITAL_COMMUNITY): Payer: Self-pay

## 2017-12-11 DIAGNOSIS — Z79899 Other long term (current) drug therapy: Secondary | ICD-10-CM | POA: Insufficient documentation

## 2017-12-11 DIAGNOSIS — N644 Mastodynia: Secondary | ICD-10-CM | POA: Insufficient documentation

## 2017-12-11 LAB — POC URINE PREG, ED: Preg Test, Ur: NEGATIVE

## 2017-12-11 MED ORDER — NAPROXEN 375 MG PO TABS: 375.0000 mg | ORAL_TABLET | Freq: Two times a day (BID) | ORAL | 0 refills | Status: AC

## 2017-12-11 NOTE — Discharge Instructions (Signed)
Please see the information and instructions below regarding your visit.  Your diagnoses today include:  1. Breast pain    Examination is reassuring today.  There is no evidence of infection in the breast.  You have a small nodule in the area of pain, and common causes in someone your age are breast cyst and fibroadenoma.  You need to see a primary doctor to obtain ultrasound to rule out any other causes.  Tests performed today include: See side panel of your discharge paperwork for testing performed today. Vital signs are listed at the bottom of these instructions.   Medications prescribed:    Take any prescribed medications only as prescribed, and any over the counter medications only as directed on the packaging.  You are prescribed naproxen, a non-steroidal anti-inflammatory agent (NSAID) for pain. You may take 375 mg every 12 hours as needed for pain. If still requiring this medication around the clock for acute pain after 10 days, please see your primary healthcare provider.  Women who are pregnant, breastfeeding, or planning on becoming pregnant should not take non-steroidal anti-inflammatories such as   You may combine this medication with Tylenol, 650 mg every 6 hours, so you are receiving something for pain every 3 hours.  This is not a long-term medication unless under the care and direction of your primary provider. Taking this medication long-term and not under the supervision of a healthcare provider could increase the risk of stomach ulcers, kidney problems, and cardiovascular problems such as high blood pressure.    Home care instructions:  Please follow any educational materials contained in this packet.   Follow-up instructions: Please follow-up with your primary care provider in one week for further evaluation of your symptoms if they are not completely improved.   Please follow up with primary care in order to obtain a breast ultrasound.  Return instructions:    Please return to the Emergency Department if you experience worsening symptoms.  Please return to the emergency department if you develop any redness of the breast, drainage from the breast, or increase in pain. Please return if you have any other emergent concerns.  Additional Information:   Your vital signs today were: BP 119/80 (BP Location: Right Arm)    Pulse 65    Temp 98.9 F (37.2 C) (Oral)    Resp 20    SpO2 99%  If your blood pressure (BP) was elevated on multiple readings during this visit above 130 for the top number or above 80 for the bottom number, please have this repeated by your primary care provider within one month. --------------  Thank you for allowing [place] to participate in your care today.

## 2017-12-11 NOTE — ED Triage Notes (Signed)
Patient complains of 1 week of right breast pain. No redness, no trauma. No fever

## 2017-12-11 NOTE — ED Provider Notes (Signed)
[facility] EMERGENCY DEPARTMENT Provider Note   CSN: [id number] Arrival date & time: 12/11/17  2130     History   Chief Complaint Chief Complaint  Patient presents with  . Breast Pain    HPI Janilah Hojnacki is a 27 y.o. female.  HPI  Patient is 27 year old female with no sniffing past medical history presenting for right breast pain.  Patient reports that her proximally 3 days.  Patient reports it is slightly erythematous, but denies any nodules, fluctuance, or swelling.  Patient denies any nipple drainage.  Patient denies any fever or chills.  Patient is not breast-feeding at present.  Last pregnancy 4 months ago.  Patient reports a family history of breast cancer in her grandmother in her 75s, but no other familial breast conditions.  Past Medical History:  Diagnosis Date  . Gall stones   . Medical history non-contributory   . No pertinent past medical history     There are no active problems to display for this patient.   Past Surgical History:  Procedure Laterality Date  . CHOLECYSTECTOMY  2016  . NO PAST SURGERIES       OB History    Gravida  4   Para  3   Term  2   Preterm  1   AB  0   Living  2     SAB  0   TAB  0   Ectopic  0   Multiple      Live Births  2            Home Medications    Prior to Admission medications   Medication Sig Start Date End Date Taking? Authorizing Provider  cephALEXin (KEFLEX) 500 MG capsule Take 1 capsule (500 mg total) by mouth 4 (four) times daily. 05/08/17   Aviva Signs, CNM  Prenatal Vit-Fe Fumarate-FA (MULTIVITAMIN-PRENATAL) 27-0.8 MG TABS tablet Take 1 tablet by mouth daily at 12 noon.    [provider]    Family History Family History  Problem Relation Age of Onset  . Heart disease Mother   . Diabetes Mother   . Hyperlipidemia Mother   . Mental retardation Father        downs syndrome  . Anesthesia problems Neg Hx   . Hypotension Neg Hx   .  Malignant hyperthermia Neg Hx   . Pseudochol deficiency Neg Hx     Social History Social History   Tobacco Use  . Smoking status: Never Smoker  . Smokeless tobacco: Never Used  Substance Use Topics  . Alcohol use: No  . Drug use: No     Allergies   Patient has no known allergies.   Review of Systems Review of Systems  Constitutional: Negative for chills and fever.  Genitourinary: Negative for vaginal bleeding.  Skin: Positive for color change.       +Breast pain - Nipple discharge     Physical Exam Updated Vital Signs BP 119/80 (BP Location: Right Arm)   Pulse 65   Temp 98.9 F (37.2 C) (Oral)   Resp 20   SpO2 99%   Physical Exam  Constitutional: She appears well-developed and well-nourished. No distress.  Sitting comfortably in bed.  HENT:  Head: Normocephalic and atraumatic.  Eyes: Conjunctivae are normal. Right eye exhibits no discharge. Left eye exhibits no discharge.  EOMs normal to gross examination.  Neck: Normal range of motion.  Cardiovascular: Normal rate and regular rhythm.  Intact, 2+ radial pulse.  Pulmonary/Chest: Effort normal and breath sounds normal. She has no wheezes. She has no rales.  Normal respiratory effort. Patient converses comfortably. No audible wheeze or stridor.  Abdominal: She exhibits no distension.  Musculoskeletal: Normal range of motion.  Neurological: She is alert.  Cranial nerves intact to gross observation. Patient moves extremities without difficulty.  Skin: Skin is warm and dry. She is not diaphoretic.  Breast exam performed with nurse tech chaperone, Malachi Bonds present.  No discoloration of the right breast.  Movement with hands over the head, leaning forward, reveals no contouring or puckering of the breast tissue.  On palpation, patient has a well-circumscribed, approximately 1 cm diameter nodular and mobile lesion around the area of pain in the 12 o'clock position of the breast.  No nipple discharge. No axillary  lymphadenopathy on right.  Psychiatric: She has a normal mood and affect. Her behavior is normal. Judgment and thought content normal.  Nursing note and vitals reviewed.    ED Treatments / Results  Labs (all labs ordered are listed, but only abnormal results are displayed) Labs Reviewed  POC URINE PREG, ED    EKG None  Radiology No results found.  Procedures Procedures (including critical care time)  Medications Ordered in ED Medications - No data to display   Initial Impression / Assessment and Plan / ED Course  I have reviewed the triage vital signs and the nursing notes.  Pertinent labs & imaging results that were available during my care of the patient were reviewed by me and considered in my medical decision making (see chart for details).     Patient well-appearing and in no acute distress.  There is no evidence of mastitis or breast abscess.  Nodular lesion in the area patient has pain suspicious for breast cyst or fibroadenoma.  Patient is not pregnant as evidenced by negative POC urine.  We will have patient take naproxen twice daily and follow-up with her primary doctor at [facility] in [place] to order breast ultrasound.  Patient return precautions for any erythema or fluctuance of breast.  Patient is in understanding and agrees with plan of care.  Final Clinical Impressions(s) / ED Diagnoses   Final diagnoses:  Breast pain    ED Discharge Orders        Ordered    naproxen (NAPROSYN) 375 MG tablet  2 times daily     12/11/17 1411       Elisha Ponder, PA-C 12/11/17 1420    Mackuen, Cindee Salt, MD 12/12/17 [phone]

## 2018-02-16 IMAGING — US US OB TRANSVAGINAL
1 series · 14 of 28 positions shown · non-contrast
Comparison: None.

CLINICAL DATA: Abdominal pain during pregnancy. Beta HCG greater
than 8333.

EXAM:
OBSTETRIC <14 WK US AND TRANSVAGINAL OB US
TECHNIQUE: Both transabdominal and transvaginal ultrasound examinations were
performed for complete evaluation of the gestation as well as the
maternal uterus, adnexal regions, and pelvic cul-de-sac.
Transvaginal technique was performed to assess early pregnancy.

[Series 1: us ob transvaginal · 0.28mm/px · 80 acquisitions, 14 frames shown]
[im 3/80]
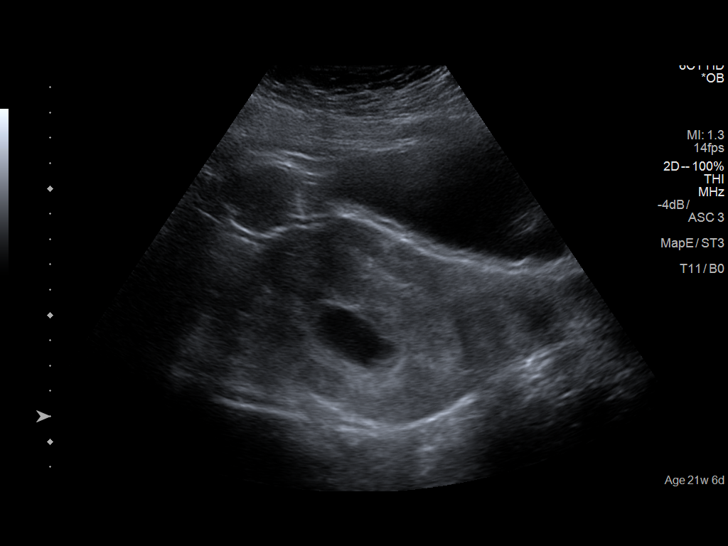
[im 9/80]
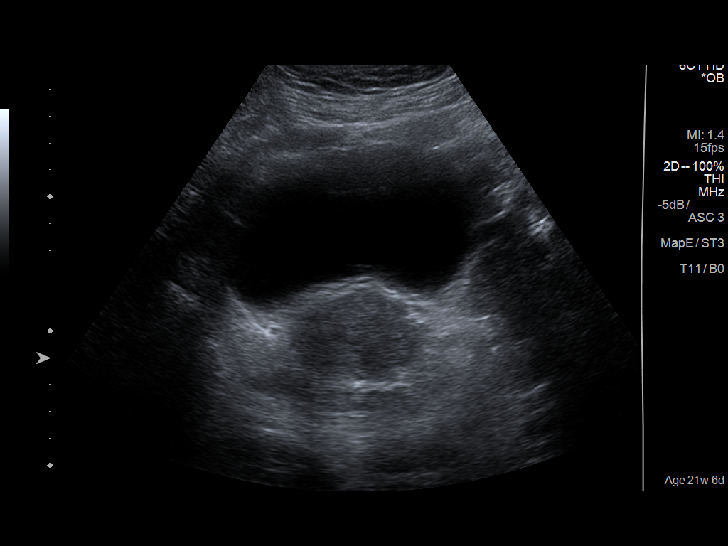
[im 15/80]
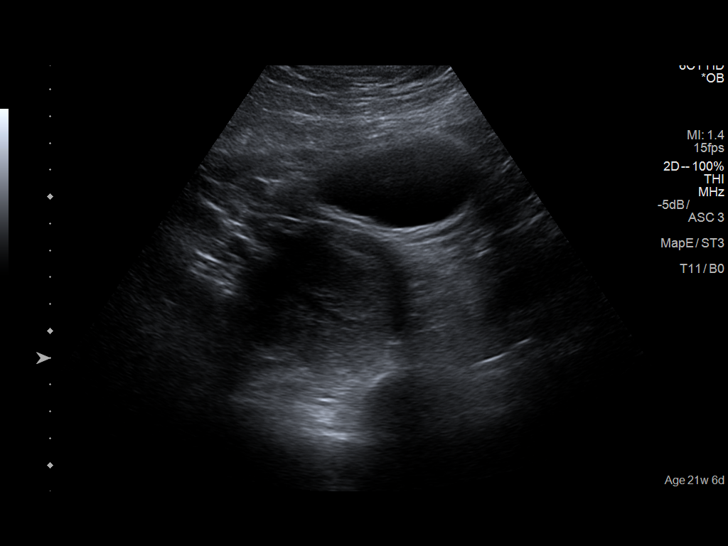
[im 21/80]
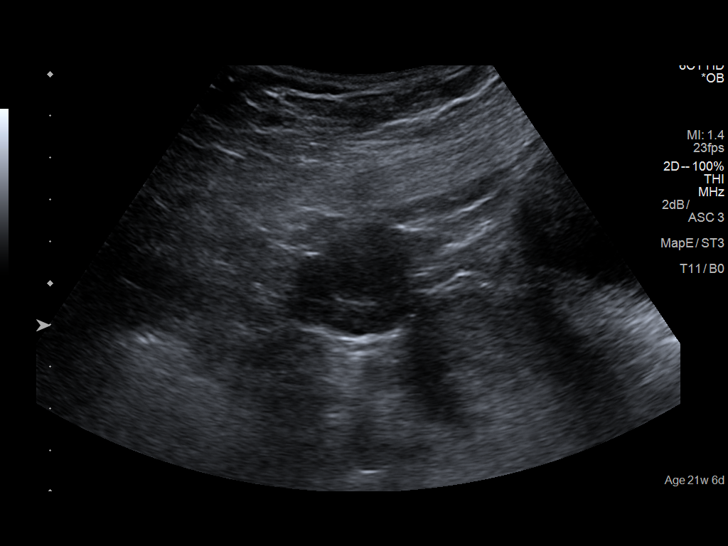
[im 27/80]
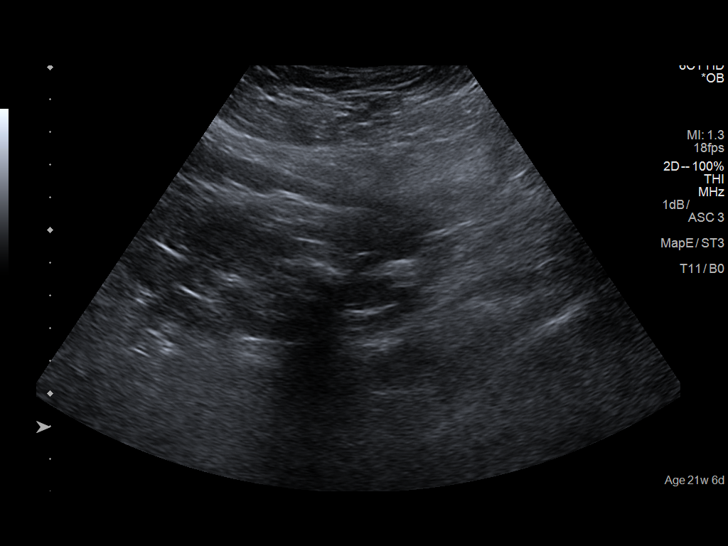
[im 33/80]
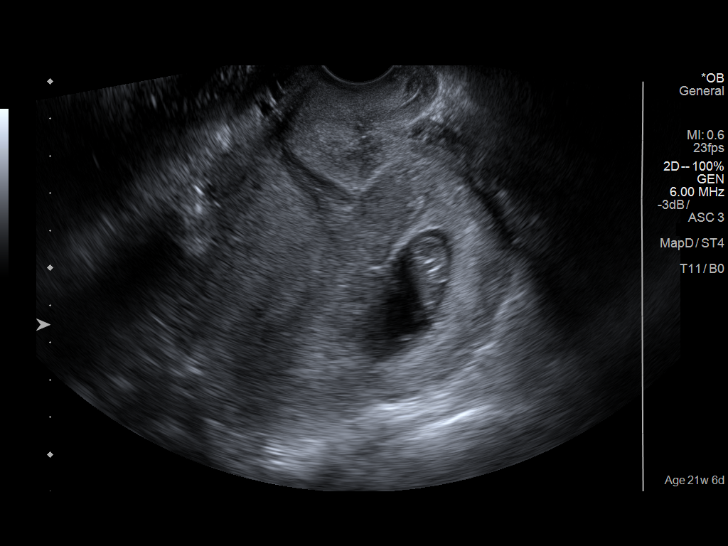
[im 39/80]
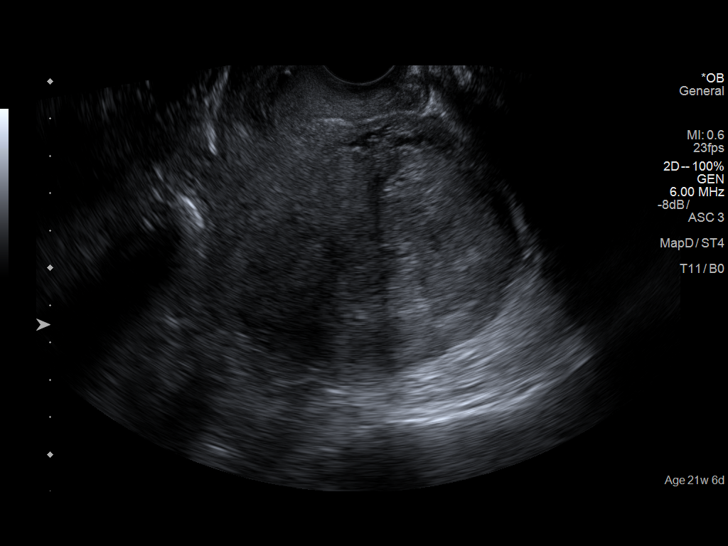
[im 44/80]
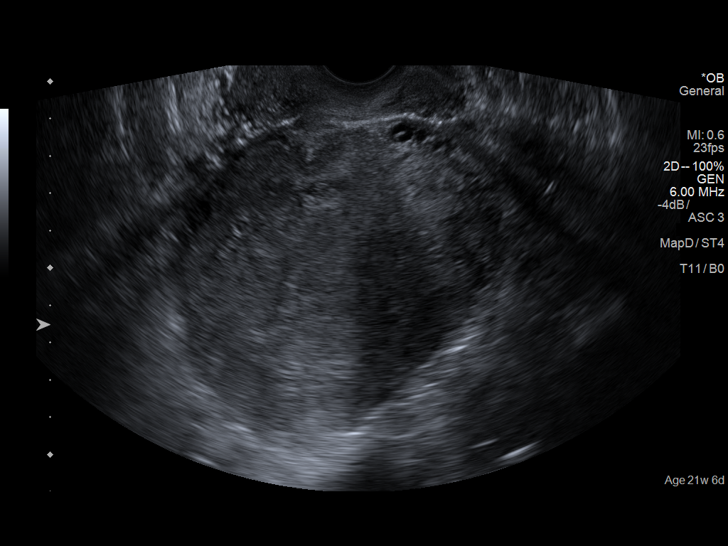
[im 50/80]
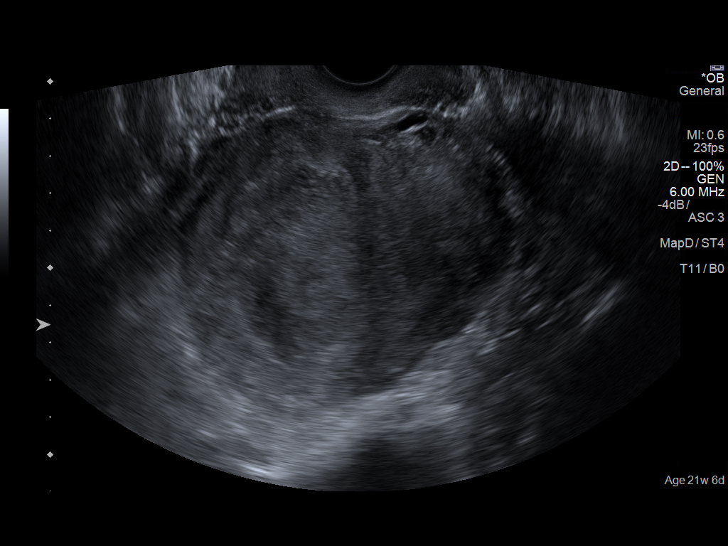
[im 56/80]
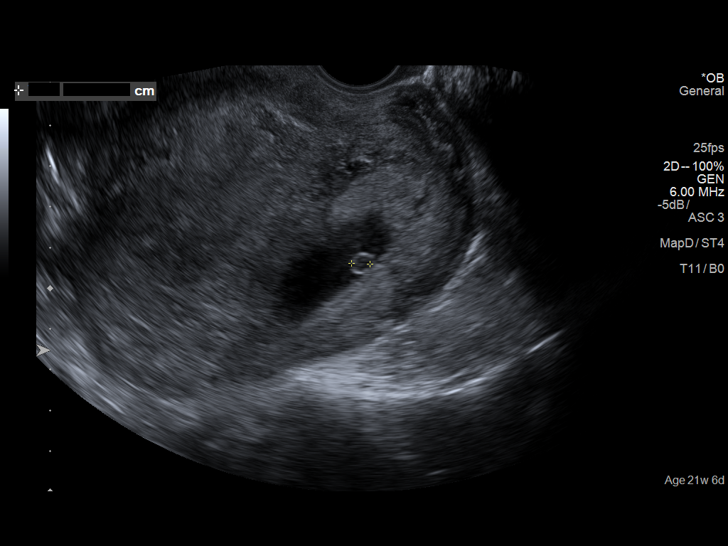
[im 62/80]
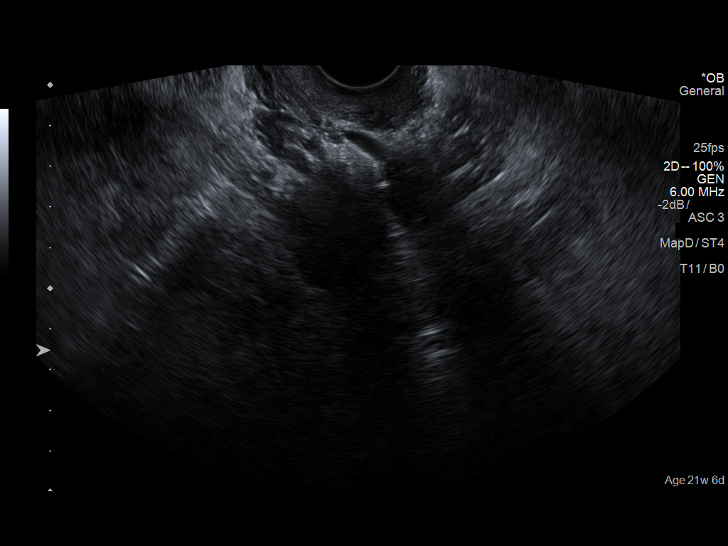
[im 68/80]
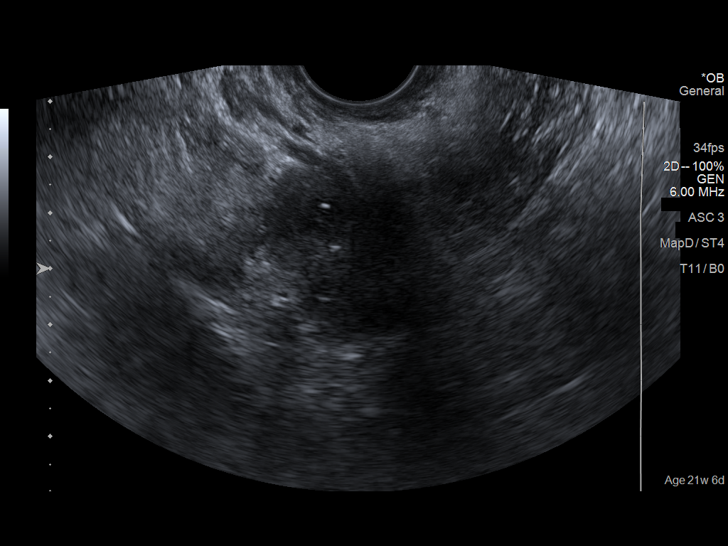
[im 74/80]
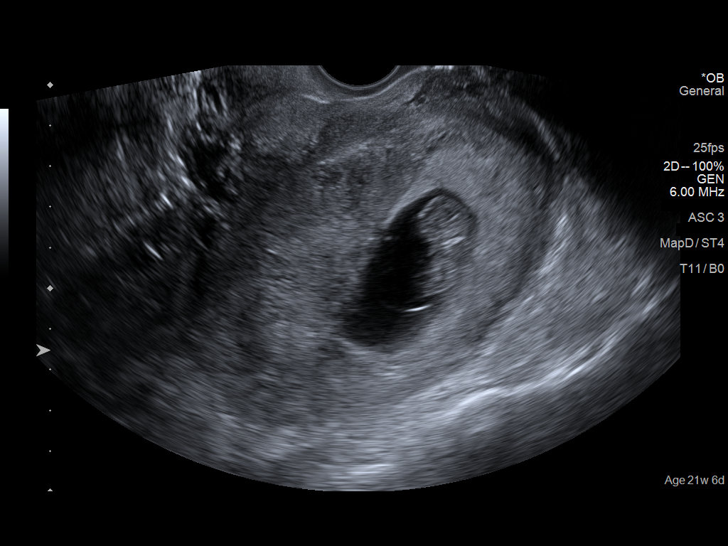
[im 80/80]
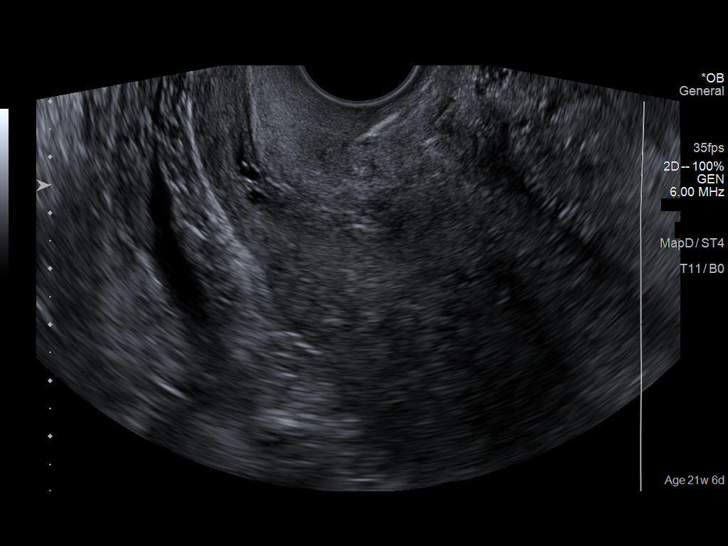

[14 of 28 positions shown; findings below may reference images not displayed]

FINDINGS: Intrauterine gestational sac: Single

Yolk sac:  Visualize

Embryo:  Visualize

Cardiac Activity: Visualize

Heart Rate: 163  bpm

CRL:  23  mm   9 w   0 d                  US EDC: 08/08/2017

Subchorionic hemorrhage:  None visualized.

Maternal uterus/adnexae: Normal
IMPRESSION: Single live intrauterine gestation at 9 weeks 0 days. Ultrasound EDC
08/08/2017. No findings for the patient's pain.

## 2018-09-07 ENCOUNTER — Other Ambulatory Visit: Payer: Self-pay | Admitting: Obstetrics and Gynecology

## 2018-09-07 ENCOUNTER — Inpatient Hospital Stay (HOSPITAL_COMMUNITY)
Admission: AD | Admit: 2018-09-07 | Discharge: 2018-09-07 | Disposition: A | Discharge status: Home / Self Care | Payer: Self-pay | Attending: Obstetrics and Gynecology | Admitting: Obstetrics and Gynecology

## 2018-09-07 ENCOUNTER — Encounter (HOSPITAL_COMMUNITY): Payer: Self-pay | Admitting: *Deleted

## 2018-09-07 DIAGNOSIS — R509 Fever, unspecified: Secondary | ICD-10-CM | POA: Insufficient documentation

## 2018-09-07 DIAGNOSIS — B950 Streptococcus, group A, as the cause of diseases classified elsewhere: Secondary | ICD-10-CM

## 2018-09-07 DIAGNOSIS — J029 Acute pharyngitis, unspecified: Secondary | ICD-10-CM | POA: Insufficient documentation

## 2018-09-07 DIAGNOSIS — N390 Urinary tract infection, site not specified: Secondary | ICD-10-CM | POA: Diagnosis present

## 2018-09-07 DIAGNOSIS — Z3A01 Less than 8 weeks gestation of pregnancy: Secondary | ICD-10-CM | POA: Insufficient documentation

## 2018-09-07 DIAGNOSIS — O26891 Other specified pregnancy related conditions, first trimester: Secondary | ICD-10-CM | POA: Insufficient documentation

## 2018-09-07 DIAGNOSIS — R6889 Other general symptoms and signs: Secondary | ICD-10-CM | POA: Diagnosis present

## 2018-09-07 DIAGNOSIS — R05 Cough: Secondary | ICD-10-CM | POA: Insufficient documentation

## 2018-09-07 LAB — URINALYSIS, MICROSCOPIC (REFLEX)

## 2018-09-07 LAB — URINALYSIS, ROUTINE W REFLEX MICROSCOPIC
Bilirubin Urine: NEGATIVE
Glucose, UA: NEGATIVE mg/dL
Hgb urine dipstick: NEGATIVE
Ketones, ur: NEGATIVE mg/dL
LEUKOCYTES UA: NEGATIVE
NITRITE: POSITIVE — AB
PH: 5 (ref 5.0–8.0)
Protein, ur: NEGATIVE mg/dL
SPECIFIC GRAVITY, URINE: 1.025 (ref 1.005–1.030)

## 2018-09-07 LAB — INFLUENZA PANEL BY PCR (TYPE A & B)
Influenza A By PCR: POSITIVE — AB
Influenza B By PCR: NEGATIVE

## 2018-09-07 LAB — POCT PREGNANCY, URINE: PREG TEST UR: NEGATIVE

## 2018-09-07 LAB — GROUP A STREP BY PCR: Group A Strep by PCR: DETECTED — AB

## 2018-09-07 MED ORDER — AMOXICILLIN 500 MG PO CAPS: 500.0000 mg | ORAL_CAPSULE | Freq: Three times a day (TID) | ORAL | 0 refills | Status: AC

## 2018-09-07 MED ORDER — SULFAMETHOXAZOLE-TRIMETHOPRIM 800-160 MG PO TABS: 1.0000 | ORAL_TABLET | Freq: Two times a day (BID) | ORAL | 0 refills | Status: DC

## 2018-09-07 MED ORDER — SULFAMETHOXAZOLE-TRIMETHOPRIM 800-160 MG PO TABS: 1.0000 | ORAL_TABLET | Freq: Two times a day (BID) | ORAL | 0 refills | Status: AC

## 2018-09-07 MED ORDER — OSELTAMIVIR PHOSPHATE 75 MG PO CAPS: 75.0000 mg | ORAL_CAPSULE | Freq: Two times a day (BID) | ORAL | 0 refills | Status: AC

## 2018-09-07 NOTE — Progress Notes (Signed)
TC to patient to notify her of (+) Flu A, (+) Strep A results and (+) urine (sent for UCx). Rx for Amoxicillin 500 mg TID x 10 days to cover strep A and UTI. Patient verbalized an understanding of the plan of care and agrees.   Raelyn Mora, CNM

## 2018-09-07 NOTE — MAU Provider Note (Signed)
History     CSN: [id number]  Arrival date and time: 09/07/18 1315   First Provider Initiated Contact with Patient 09/07/18 1355      Chief Complaint  Patient presents with  . Sore Throat  . Fever  . Headache  . Generalized Body Aches  . Cough   HPI  Ms.  Victoria Orozco is a 28 y.o. year old [id number] female who is possibly pregnant  presents to MAU reporting fever off and on x 2 days, cough, body aches, chills, fatigue and sore throat. She has had no known sick exposures, but does have small children that attend elementary school. She thinks she is early pregnant, because she has missed her period.  Past Medical History:  Diagnosis Date  . Gall stones   . Medical history non-contributory   . No pertinent past medical history     Past Surgical History:  Procedure Laterality Date  . CHOLECYSTECTOMY  2016  . NO PAST SURGERIES      Family History  Problem Relation Age of Onset  . Heart disease Mother   . Diabetes Mother   . Hyperlipidemia Mother   . Mental retardation Father        downs syndrome  . Anesthesia problems Neg Hx   . Hypotension Neg Hx   . Malignant hyperthermia Neg Hx   . Pseudochol deficiency Neg Hx     Social History   Tobacco Use  . Smoking status: Never Smoker  . Smokeless tobacco: Never Used  Substance Use Topics  . Alcohol use: No  . Drug use: No    Allergies: No Known Allergies  No medications prior to admission.    Review of Systems  Constitutional: Positive for chills and fatigue.  HENT: Positive for congestion and sore throat.   Eyes: Negative.   Respiratory: Positive for cough.   Cardiovascular: Negative.   Gastrointestinal: Negative.   Endocrine: Negative.   Genitourinary:       Missed period  Musculoskeletal: Positive for myalgias.  Skin: Negative.   Allergic/Immunologic: Negative.   Neurological: Negative.   Hematological: Negative.   Psychiatric/Behavioral: Negative.    Physical Exam   Blood pressure  123/69, pulse (!) 111, temperature (!) 100.6 F (38.1 C), temperature source Oral, resp. rate 18, height 5\' 4"  (1.626 m), weight 109.8 kg, last menstrual period 07/17/2018, SpO2 97 %, unknown if currently breastfeeding.  Physical Exam  Nursing note and vitals reviewed. Constitutional: She is oriented to person, place, and time. She appears well-developed.  HENT:  Head: Normocephalic and atraumatic.  Eyes: Pupils are equal, round, and reactive to light.  Neck: Normal range of motion.  Cardiovascular: Normal rate.  Respiratory: Effort normal and breath sounds normal.  GI: Soft. Bowel sounds are normal.  Genitourinary:    Genitourinary Comments: deferred   Musculoskeletal: Normal range of motion.  Neurological: She is alert and oriented to person, place, and time. She has normal reflexes.  Skin: Skin is warm and dry.  Psychiatric: She has a normal mood and affect. Her behavior is normal. Judgment and thought content normal.    MAU Course  Procedures  MDM CCUA UCx UPT Flu Swab Strep A throat swab   Results for orders placed or performed during the hospital encounter of 09/07/18 (from the past 48 hour(s))  Influenza panel by PCR (type A & B)     Status: Abnormal   Collection Time: 09/07/18  1:40 PM  Result Value Ref Range   Influenza A By PCR POSITIVE (A)  NEGATIVE   Influenza B By PCR NEGATIVE NEGATIVE    Comment: (NOTE) The Xpert Xpress Flu assay is intended as an aid in the diagnosis of  influenza and should not be used as a sole basis for treatment.  This  assay is FDA approved for nasopharyngeal swab specimens only. Nasal  washings and aspirates are unacceptable for Xpert Xpress Flu testing. Performed at [facility], [address] [place], [place] [address]   Urinalysis, Routine w reflex microscopic     Status: Abnormal   Collection Time: 09/07/18  1:40 PM  Result Value Ref Range   Color, Urine YELLOW YELLOW   APPearance CLEAR CLEAR   Specific Gravity, Urine  1.025 1.005 - 1.030   pH 5.0 5.0 - 8.0   Glucose, UA NEGATIVE NEGATIVE mg/dL   Hgb urine dipstick NEGATIVE NEGATIVE   Bilirubin Urine NEGATIVE NEGATIVE   Ketones, ur NEGATIVE NEGATIVE mg/dL   Protein, ur NEGATIVE NEGATIVE mg/dL   Nitrite POSITIVE (A) NEGATIVE   Leukocytes, UA NEGATIVE NEGATIVE    Comment: Performed at [facility], [address] [place], [place] [address]  Urinalysis, Microscopic (reflex)     Status: Abnormal   Collection Time: 09/07/18  1:40 PM  Result Value Ref Range   RBC / HPF 0-5 0 - 5 RBC/hpf   WBC, UA 0-5 0 - 5 WBC/hpf   Bacteria, UA MANY (A) NONE SEEN   Squamous Epithelial / LPF 0-5 0 - 5    Comment: Performed at [facility], [address], [place], [place] [address]  Group A Strep by PCR     Status: Abnormal   Collection Time: 09/07/18  1:48 PM  Result Value Ref Range   Group A Strep by PCR DETECTED (A) NOT DETECTED    Comment: Performed at [facility], [address], [place], [place] [address]  Pregnancy, urine POC     Status: None   Collection Time: 09/07/18  2:05 PM  Result Value Ref Range   Preg Test, Ur NEGATIVE NEGATIVE    Comment:        THE SENSITIVITY OF THIS METHODOLOGY IS >24 mIU/mL     Assessment and Plan  Flu-like symptoms - Rx'd Tamiflu 75 mg BID - Information provided on influenza in adult   Urinary tract infection without hematuria, site unspecified - Rx for Bactrim DS BID - Information provided on UTI  - Discharge home  - Advised that UPT was negative - F/U with PCP or Urgent Care - Patient verbalized an understanding of the plan of care and agrees.    Raelyn Mora, MSN, CNM 09/07/2018, 1:55 PM

## 2018-09-07 NOTE — MAU Note (Signed)
Pt reports fever off/on for 2 days, cough, body aches, chills, fatigue, sore throat

## 2021-03-02 ENCOUNTER — Emergency Department (HOSPITAL_COMMUNITY): Payer: Self-pay

## 2021-03-02 ENCOUNTER — Other Ambulatory Visit: Payer: Self-pay

## 2021-03-02 ENCOUNTER — Encounter (HOSPITAL_COMMUNITY): Payer: Self-pay | Admitting: Emergency Medicine

## 2021-03-02 ENCOUNTER — Emergency Department (HOSPITAL_COMMUNITY)
Admission: EM | Admit: 2021-03-02 | Discharge: 2021-03-02 | Disposition: A | Discharge status: Home / Self Care | Payer: Self-pay | Attending: Emergency Medicine | Admitting: Emergency Medicine

## 2021-03-02 DIAGNOSIS — R0789 Other chest pain: Secondary | ICD-10-CM | POA: Insufficient documentation

## 2021-03-02 DIAGNOSIS — L299 Pruritus, unspecified: Secondary | ICD-10-CM | POA: Insufficient documentation

## 2021-03-02 DIAGNOSIS — R079 Chest pain, unspecified: Secondary | ICD-10-CM

## 2021-03-02 LAB — CBC WITH DIFFERENTIAL/PLATELET
Abs Immature Granulocytes: 0.02 10*3/uL (ref 0.00–0.07)
Basophils Absolute: 0.1 10*3/uL (ref 0.0–0.1)
Basophils Relative: 1 %
Eosinophils Absolute: 0.1 10*3/uL (ref 0.0–0.5)
Eosinophils Relative: 2 %
HCT: 39.6 % (ref 36.0–46.0)
Hemoglobin: 13.2 g/dL (ref 12.0–15.0)
Immature Granulocytes: 0 %
Lymphocytes Relative: 45 %
Lymphs Abs: 4 10*3/uL (ref 0.7–4.0)
MCH: 29.3 pg (ref 26.0–34.0)
MCHC: 33.3 g/dL (ref 30.0–36.0)
MCV: 88 fL (ref 80.0–100.0)
Monocytes Absolute: 0.5 10*3/uL (ref 0.1–1.0)
Monocytes Relative: 6 %
Neutro Abs: 4.2 10*3/uL (ref 1.7–7.7)
Neutrophils Relative %: 46 %
Platelets: 344 10*3/uL (ref 150–400)
RBC: 4.5 MIL/uL (ref 3.87–5.11)
RDW: 13.1 % (ref 11.5–15.5)
WBC: 8.9 10*3/uL (ref 4.0–10.5)
nRBC: 0 % (ref 0.0–0.2)

## 2021-03-02 LAB — BASIC METABOLIC PANEL
Anion gap: 10 (ref 5–15)
BUN: 12 mg/dL (ref 6–20)
CO2: 24 mmol/L (ref 22–32)
Calcium: 9.4 mg/dL (ref 8.9–10.3)
Chloride: 106 mmol/L (ref 98–111)
Creatinine, Ser: 0.79 mg/dL (ref 0.44–1.00)
GFR, Estimated: >60 mL/min (ref >60)
Glucose, Bld: 94 mg/dL (ref 70–99)
Potassium: 3.8 mmol/L (ref 3.5–5.1)
Sodium: 140 mmol/L (ref 135–145)

## 2021-03-02 LAB — TROPONIN I (HIGH SENSITIVITY)
Troponin I (High Sensitivity): 3 ng/L (ref <18)
Troponin I (High Sensitivity): 3 ng/L (ref <18)

## 2021-03-02 LAB — I-STAT BETA HCG BLOOD, ED (MC, WL, AP ONLY): I-stat hCG, quantitative: <5 m[IU]/mL (ref <5)

## 2021-03-02 MED ORDER — HYDROXYZINE HCL 25 MG PO TABS: 25.0000 mg | ORAL_TABLET | Freq: Three times a day (TID) | ORAL | 0 refills | Status: AC | PRN

## 2021-03-02 MED ORDER — IBUPROFEN 600 MG PO TABS: 600.0000 mg | ORAL_TABLET | Freq: Three times a day (TID) | ORAL | 0 refills | Status: AC | PRN

## 2021-03-02 MED ORDER — DIPHENHYDRAMINE HCL 25 MG PO TABS: 25.0000 mg | ORAL_TABLET | Freq: Every evening | ORAL | 0 refills | Status: AC | PRN

## 2021-03-02 NOTE — ED Provider Notes (Signed)
Emergency Medicine Provider Triage Evaluation Note  Victoria Orozco , a 30 y.o. female  was evaluated in triage.  Pt complains of multiple complaints. CP began yesterday. No radiation. Located diffusely across entire anterior chest wall. Pain intermittent. No exertional or pleuritic in nature. No associated SOB, emesis, diaphoresis. No hx of PE, DVT, hormone use. Occasional BL LE swelling however no unilateral swelling. No hx of cardiac or pulm disorders. Also with intermittent pruritic rash x months. No associated SOB, sensation of throat closing.   Review of Systems  Positive: CP, rash, sob, cough, abd pain, emesis Negative: Fever, neck pain Physical Exam  BP (!) 137/102 (BP Location: Right Arm)   Pulse 66   Temp 99 F (37.2 C) (Oral)   Resp 16   LMP 02/11/2021   SpO2 100%  Gen:   Awake, no distress   Resp:  Normal effort  MSK:   Moves extremities without difficulty  Other:    Medical Decision Making  Medically screening exam initiated at 8:13 AM.  Appropriate orders placed.  Kellie Murrill was informed that the remainder of the evaluation will be completed by another provider, this initial triage assessment does not replace that evaluation, and the importance of remaining in the ED until their evaluation is complete.  CP, rash  VS stable, work up started   [employer], Valli Glance, PA-C 03/02/21 4888    Margarita Grizzle, MD 03/02/21 [phone]

## 2021-03-02 NOTE — ED Provider Notes (Signed)
[facility] EMERGENCY DEPARTMENT Provider Note   CSN: [id number] Arrival date & time: 03/02/21  0751     History Chief Complaint  Patient presents with   Chest Pain    Victoria Orozco is a 30 y.o. female presenting to emergency department with generalized itching, and discomfort.  She reports she has had a sharp stabbing chest pain in the middle of her chest.  This started 2 days ago.  Is intermittent.  It lasts a few seconds at a time and goes away.  She denies nausea vomiting or diarrhea.  Nothing makes it better or worse.  She currently has minimal symptoms.  She also reports she has had itching of her arms and legs for the past few days.  She feels that this is worse after hot shower.  She thinks she has dry skin.  She has not used any lotion for this.  No hemoptysis or asymmetric LE edema. Patient denies personal or family history of DVT or PE. No recent hormone use (including OCP); travel for >6 hours; prolonged immobilization for greater than 3 days; surgeries or trauma in the last 4 weeks; or malignancy with treatment within 6 months.   HPI     Past Medical History:  Diagnosis Date   Gall stones    Medical history non-contributory    No pertinent past medical history     Patient Active Problem List   Diagnosis Date Noted   Flu-like symptoms 09/07/2018   UTI (urinary tract infection) 09/07/2018    Past Surgical History:  Procedure Laterality Date   CHOLECYSTECTOMY  2016   NO PAST SURGERIES       OB History     Gravida  4   Para  3   Term  2   Preterm  1   AB  0   Living  2      SAB  0   IAB  0   Ectopic  0   Multiple      Live Births  2           Family History  Problem Relation Age of Onset   Heart disease Mother    Diabetes Mother    Hyperlipidemia Mother    Mental retardation Father        downs syndrome   Anesthesia problems Neg Hx    Hypotension Neg Hx    Malignant hyperthermia Neg Hx     Pseudochol deficiency Neg Hx     Social History   Tobacco Use   Smoking status: Never   Smokeless tobacco: Never  Substance Use Topics   Alcohol use: No   Drug use: No    Home Medications Prior to Admission medications   Medication Sig Start Date End Date Taking? Authorizing Provider  diphenhydrAMINE (BENADRYL) 25 MG tablet Take 1 tablet (25 mg total) by mouth at bedtime as needed for up to 15 doses for itching. 03/02/21  Yes Veniamin Kincaid, Kermit Balo, MD  hydrOXYzine (ATARAX/VISTARIL) 25 MG tablet Take 1 tablet (25 mg total) by mouth every 8 (eight) hours as needed for up to 21 days. 03/02/21 03/23/21 Yes Tayen Narang, Kermit Balo, MD  ibuprofen (ADVIL) 600 MG tablet Take 1 tablet (600 mg total) by mouth every 8 (eight) hours as needed for moderate pain or mild pain. For chest pain, and during heavy menstrual periods 03/02/21 04/01/21 Yes Adan Baehr, Kermit Balo, MD  naproxen (NAPROSYN) 375 MG tablet Take 1 tablet (375 mg total) by mouth 2 (two)  times daily. 12/11/17   Aviva Kluver B, PA-C  oseltamivir (TAMIFLU) 75 MG capsule Take 1 capsule (75 mg total) by mouth every 12 (twelve) hours. 09/07/18   Raelyn Mora, CNM  Prenatal Vit-Fe Fumarate-FA (MULTIVITAMIN-PRENATAL) 27-0.8 MG TABS tablet Take 1 tablet by mouth daily at 12 noon.    [provider]  sulfamethoxazole-trimethoprim (BACTRIM DS,SEPTRA DS) 800-160 MG tablet Take 1 tablet by mouth 2 (two) times daily. 09/07/18   Raelyn Mora, CNM    Allergies    Patient has no known allergies.  Review of Systems   Review of Systems  Constitutional:  Negative for chills and fever.  HENT:  Negative for ear pain and sore throat.   Eyes:  Negative for pain and visual disturbance.  Respiratory:  Negative for cough and shortness of breath.   Cardiovascular:  Positive for chest pain. Negative for palpitations.  Gastrointestinal:  Negative for abdominal pain and vomiting.  Genitourinary:  Negative for dysuria and hematuria.  Musculoskeletal:  Negative for  arthralgias and back pain.  Skin:  Negative for color change and rash.  Neurological:  Negative for seizures and syncope.  All other systems reviewed and are negative.  Physical Exam Updated Vital Signs BP 116/78   Pulse 64   Temp 98 F (36.7 C) (Oral)   Resp 18   LMP 02/11/2021   SpO2 100%   Physical Exam Constitutional:      General: She is not in acute distress.    Appearance: She is obese.  HENT:     Head: Normocephalic and atraumatic.  Eyes:     Conjunctiva/sclera: Conjunctivae normal.     Pupils: Pupils are equal, round, and reactive to light.  Cardiovascular:     Rate and Rhythm: Normal rate and regular rhythm.  Pulmonary:     Effort: Pulmonary effort is normal. No respiratory distress.  Abdominal:     General: There is no distension.     Tenderness: There is no abdominal tenderness.  Skin:    General: Skin is warm and dry.     Coloration: Skin is not pale.     Findings: No rash.  Neurological:     General: No focal deficit present.     Mental Status: She is alert. Mental status is at baseline.  Psychiatric:        Mood and Affect: Mood normal.        Behavior: Behavior normal.    ED Results / Procedures / Treatments   Labs (all labs ordered are listed, but only abnormal results are displayed) Labs Reviewed  CBC WITH DIFFERENTIAL/PLATELET  BASIC METABOLIC PANEL  I-STAT BETA HCG BLOOD, ED ([facility], [facility], [facility] ONLY)  TROPONIN I (HIGH SENSITIVITY)  TROPONIN I (HIGH SENSITIVITY)    EKG None  Radiology DG Chest 2 View  Result Date: 03/02/2021 CLINICAL DATA:  Chest pain.  Shortness of breath. EXAM: CHEST - 2 VIEW COMPARISON:  None. FINDINGS: The heart size and mediastinal contours are within normal limits. Both lungs are clear. The visualized skeletal structures are unremarkable. IMPRESSION: No active cardiopulmonary disease. Electronically Signed   By: Gerome Sam III M.D   On: 03/02/2021 09:08    Procedures Procedures   Medications Ordered in  ED Medications - No data to display  ED Course  I have reviewed the triage vital signs and the nursing notes.  Pertinent labs & imaging results that were available during my care of the patient were reviewed by me and considered in my medical decision making (  see chart for details).  Patient presenting to the emergency department chest pain.  Overall this is extremely atypical for ACS, PE, pneumothorax, aortic dissection.  The pain is sharp, short lasting, sporadic, and she does not currently have pain.  I suspect more likely costochondritis.  I will prescribe ibuprofen for several days.  Also describes dry skin and skin itching.  I do not see any evidence of jaundice on exam.  I doubt this is acute biliary dysfunction.  Did not see any lesions.  There is anything suspicious of scabies or infectious process (her fianc is present at bedside and has no similar symptoms).  I advised aloe skin cream, can prescribe some Vistaril and Benadryl for itching at night.     Final Clinical Impression(s) / ED Diagnoses Final diagnoses:  Itching  Chest pain, unspecified type    Rx / DC Orders ED Discharge Orders          Ordered    hydrOXYzine (ATARAX/VISTARIL) 25 MG tablet  Every 8 hours PRN        03/02/21 1530    diphenhydrAMINE (BENADRYL) 25 MG tablet  At bedtime PRN        03/02/21 1530    ibuprofen (ADVIL) 600 MG tablet  Every 8 hours PRN        03/02/21 1530             Stevee Valenta, Kermit Balo, MD 03/02/21 2257

## 2021-03-02 NOTE — Discharge Instructions (Addendum)
You should buy a skin cream that has aloe in it and use it twice a day to keep your skin moist.  Dry skin can cause itching.  I also prescribed a medicine called Vistaril which you can take in the daytime for itching, and Benadryl take as needed at night.  Try not to use this medication for more than 5 days in a row.  I also prescribed you ibuprofen to take for your chest pain.  I expect the symptoms to improve.  We talked about your heavy periods.  I prescribed the same ibuprofen that you can take every 8 hours when you are having your menstrual cycle.  Please call the OB/GYN office above to schedule an appointment for your irregular periods.

## 2021-03-02 NOTE — ED Triage Notes (Addendum)
Pt reports intermittent pain to center of chest since yesterday.  Also reports itchy bumps on top of head, itching all over, and SOB.  Denies chest pain at present.

## 2021-07-16 ENCOUNTER — Emergency Department (HOSPITAL_COMMUNITY)
Admission: EM | Admit: 2021-07-16 | Discharge: 2021-07-17 | Disposition: A | Discharge status: Home / Self Care | Payer: Self-pay | Attending: Emergency Medicine | Admitting: Emergency Medicine

## 2021-07-16 DIAGNOSIS — M546 Pain in thoracic spine: Secondary | ICD-10-CM | POA: Insufficient documentation

## 2021-07-16 MED ORDER — ACETAMINOPHEN 500 MG PO TABS
1000.0000 mg | ORAL_TABLET | Freq: Once | ORAL | Status: AC
  Administered 2021-07-16: 23:00:00 1000 mg via ORAL
  Filled 2021-07-16: qty 2

## 2021-07-16 NOTE — ED Provider Notes (Signed)
MSE was initiated and I personally evaluated the patient and placed orders (if any) at  10:32 PM on July 16, 2021.  Patient to ED with pain to bilateral upper back x 3 months. Now has a painful lump on the right lower thoracic back. Better with rest, worse with movement. Worse tonight prompting ED visit. No PCP. Otherwise healthy.  Today's Vitals   07/16/21 2101 07/16/21 2119  BP:  (!) 143/93  Pulse:  86  Resp:  15  Temp:  98.5 F (36.9 C)  TempSrc:  Oral  SpO2:  97%  PainSc: 6     There is no height or weight on file to calculate BMI.  Upper back tender bilaterally. No redness or swelling. Mobile, tender mass right parathoracic back.   The patient appears stable so that the remainder of the MSE may be completed by another provider.   Elpidio Anis, PA-C 07/16/21 2234    Terald Sleeper, MD 07/17/21 [phone]

## 2021-07-16 NOTE — ED Triage Notes (Signed)
Pt c/o spinal pain & "lump" on R side of mid-back x26mos. States it hurts to sit straight up, hurts to sit period. Pain worse when walking, better when laying flat. Denies recent injury, had TV fall on back 10+yrs ago, unsure if related. Also advises period is irregular, no urinary symptoms

## 2021-07-17 ENCOUNTER — Other Ambulatory Visit: Payer: Self-pay

## 2021-07-17 ENCOUNTER — Emergency Department (HOSPITAL_COMMUNITY): Payer: Self-pay

## 2021-07-17 LAB — I-STAT BETA HCG BLOOD, ED (MC, WL, AP ONLY): I-stat hCG, quantitative: <5 m[IU]/mL (ref <5)

## 2021-07-17 MED ORDER — METHOCARBAMOL 500 MG PO TABS: 500.0000 mg | ORAL_TABLET | Freq: Two times a day (BID) | ORAL | 0 refills | Status: AC | PRN

## 2021-07-17 NOTE — ED Provider Notes (Signed)
[facility] EMERGENCY DEPARTMENT Provider Note   CSN: [id number] Arrival date & time: 07/16/21  2011     History Chief Complaint  Patient presents with   Back Pain    Victoria Orozco is a 30 y.o. female.  HPI  Patient is a 30 year old female who presents to the emergency department due to back pain.  Patient states that about 10 years ago a TV fell on her back.  She states that over the past few months she has had pain in the upper back.  She states that she has a tender nodule just right lateral of the thoracic spine.  States her pain worsens with movement.  States has been taking ibuprofen and Tylenol with mild short-term relief.  No new falls or injuries.  Denies any fevers, chills, nausea, vomiting, night sweats, unintentional weight loss, history of IVDA, numbness, weakness.     Past Medical History:  Diagnosis Date   Gall stones    Medical history non-contributory    No pertinent past medical history     Patient Active Problem List   Diagnosis Date Noted   Flu-like symptoms 09/07/2018   UTI (urinary tract infection) 09/07/2018    Past Surgical History:  Procedure Laterality Date   CHOLECYSTECTOMY  2016   NO PAST SURGERIES       OB History     Gravida  4   Para  3   Term  2   Preterm  1   AB  0   Living  2      SAB  0   IAB  0   Ectopic  0   Multiple      Live Births  2           Family History  Problem Relation Age of Onset   Heart disease Mother    Diabetes Mother    Hyperlipidemia Mother    Mental retardation Father        downs syndrome   Anesthesia problems Neg Hx    Hypotension Neg Hx    Malignant hyperthermia Neg Hx    Pseudochol deficiency Neg Hx     Social History   Tobacco Use   Smoking status: Never   Smokeless tobacco: Never  Substance Use Topics   Alcohol use: No   Drug use: No    Home Medications Prior to Admission medications   Medication Sig Start Date End Date Taking?  Authorizing Provider  methocarbamol (ROBAXIN) 500 MG tablet Take 1 tablet (500 mg total) by mouth 2 (two) times daily as needed for muscle spasms. 07/17/21  Yes Placido Sou, PA-C  diphenhydrAMINE (BENADRYL) 25 MG tablet Take 1 tablet (25 mg total) by mouth at bedtime as needed for up to 15 doses for itching. 03/02/21   Terald Sleeper, MD  naproxen (NAPROSYN) 375 MG tablet Take 1 tablet (375 mg total) by mouth 2 (two) times daily. 12/11/17   Aviva Kluver B, PA-C  oseltamivir (TAMIFLU) 75 MG capsule Take 1 capsule (75 mg total) by mouth every 12 (twelve) hours. 09/07/18   Raelyn Mora, CNM  Prenatal Vit-Fe Fumarate-FA (MULTIVITAMIN-PRENATAL) 27-0.8 MG TABS tablet Take 1 tablet by mouth daily at 12 noon.    [provider]  sulfamethoxazole-trimethoprim (BACTRIM DS,SEPTRA DS) 800-160 MG tablet Take 1 tablet by mouth 2 (two) times daily. 09/07/18   Raelyn Mora, CNM    Allergies    Patient has no known allergies.  Review of Systems   Review of  Systems  All other systems reviewed and are negative. Ten systems reviewed and are negative for acute change, except as noted in the HPI.   Physical Exam Updated Vital Signs BP 130/76 (BP Location: Left Arm)    Pulse 70    Temp 98.5 F (36.9 C) (Oral)    Resp 16    SpO2 99%   Physical Exam Vitals and nursing note reviewed.  Constitutional:      General: She is not in acute distress.    Appearance: Normal appearance. She is not ill-appearing, toxic-appearing or diaphoretic.  HENT:     Head: Normocephalic and atraumatic.     Right Ear: External ear normal.     Left Ear: External ear normal.     Nose: Nose normal.     Mouth/Throat:     Mouth: Mucous membranes are moist.     Pharynx: Oropharynx is clear. No oropharyngeal exudate or posterior oropharyngeal erythema.  Eyes:     General: No scleral icterus.       Right eye: No discharge.        Left eye: No discharge.     Extraocular Movements: Extraocular movements intact.      Conjunctiva/sclera: Conjunctivae normal.  Cardiovascular:     Rate and Rhythm: Normal rate.     Pulses: Normal pulses.  Pulmonary:     Effort: Pulmonary effort is normal.  Abdominal:     General: Abdomen is flat.     Palpations: Abdomen is soft.     Tenderness: There is no abdominal tenderness.  Musculoskeletal:        General: Tenderness present. Normal range of motion.     Cervical back: Normal range of motion and neck supple. No tenderness.     Comments: Mild TTP noted diffusely along the midline thoracic spine.  No step-offs, crepitus, or deformities.  Additional mild TTP noted along the bilateral thoracic paraspinal musculature.  There is a small, tender, mobile nodule about 1 cm in size to the right side of the thoracic spine.  No overlying skin changes.  Skin:    General: Skin is warm and dry.  Neurological:     General: No focal deficit present.     Mental Status: She is alert and oriented to person, place, and time.     Comments: Strength is 5/5 in the bilateral lower extremities.  Distal sensation intact.  2+ pedal pulses.  Psychiatric:        Mood and Affect: Mood normal.        Behavior: Behavior normal.   ED Results / Procedures / Treatments   Labs (all labs ordered are listed, but only abnormal results are displayed) Labs Reviewed  I-STAT BETA HCG BLOOD, ED ([facility], [facility], [facility] ONLY)   EKG None  Radiology DG Thoracic Spine 2 View  Result Date: 07/17/2021 CLINICAL DATA:  30 year old female with history of back pain. EXAM: THORACIC SPINE 2 VIEWS COMPARISON:  No priors. FINDINGS: There is no evidence of thoracic spine fracture. Alignment is normal. No other significant bone abnormalities are identified. Surgical clips project over the right upper quadrant of the abdomen, likely from prior cholecystectomy. IMPRESSION: Negative. Electronically Signed   By: Trudie Reed M.D.   On: 07/17/2021 06:14    Procedures Procedures   Medications Ordered in ED Medications   acetaminophen (TYLENOL) tablet 1,000 mg (1,000 mg Oral Given 07/16/21 2243)   ED Course  I have reviewed the triage vital signs and the nursing notes.  Pertinent labs &  imaging results that were available during my care of the patient were reviewed by me and considered in my medical decision making (see chart for details).    MDM Rules/Calculators/A&P                          Pt is a 31 y.o. female who presents to the ED due to back pain.  Labs: I-STAT beta-hCG less than 5.  Imaging: X-ray of the thoracic spine is negative.  I, Placido Sou, PA-C, personally reviewed and evaluated these images and lab results as part of my medical decision-making.  Patient's symptoms appear to be musculoskeletal.  Patient has mild tenderness along the midline thoracic spine diffusely as well as the bilateral paraspinal musculature.  She does have a small tender mobile nodule in the right thoracic region.  No overlying skin changes.  Patient denies any fevers, chills, nausea, vomiting, IVDA, night sweats, unexplained weight loss.  Patient currently afebrile and not tachycardic.  Doubt infectious or malignant etiology.  Will discharge patient on a course of Robaxin.  We discussed safety regarding this medication.  Recommended continued use of Tylenol as well as ibuprofen.  Patient given a referral to orthopedics if she finds that her symptoms are refractory.  We discussed return precautions.  Her questions were answered and she was amicable at the time of discharge.  Note: Portions of this report may have been transcribed using voice recognition software. Every effort was made to ensure accuracy; however, inadvertent computerized transcription errors may be present.   Final Clinical Impression(s) / ED Diagnoses Final diagnoses:  Acute bilateral thoracic back pain   Rx / DC Orders ED Discharge Orders          Ordered    methocarbamol (ROBAXIN) 500 MG tablet  2 times daily PRN        07/17/21  0639             Placido Sou, PA-C 07/17/21 7591    Tilden Fossa, MD 07/17/21 [phone]

## 2021-07-17 NOTE — Discharge Instructions (Addendum)
I am prescribing you a strong muscle relaxer called Robaxin.  You can take this up to 2 times a day for management of your pain.  This medication can be sedating so do not mix it with alcohol.  Do not drive a car after taking it.  Below is the contact information for Dr. Jena Gauss with orthopedics.  Please give them a call and schedule an appointment for reevaluation.  If you develop any new or worsening symptoms please come back to the emergency department.

## 2021-07-17 NOTE — ED Notes (Signed)
Lower back pain for 3 months

## 2021-07-17 NOTE — ED Notes (Signed)
Pt denies numbness and tingling. Pt denies loss of bowel or bladder w/back pain. Pt is able to ambulate.

## 2022-07-22 ENCOUNTER — Emergency Department (HOSPITAL_COMMUNITY)
Admission: EM | Admit: 2022-07-22 | Discharge: 2022-07-23 | Disposition: A | Discharge status: Home / Self Care | Payer: Self-pay | Attending: Emergency Medicine | Admitting: Emergency Medicine

## 2022-07-22 ENCOUNTER — Other Ambulatory Visit: Payer: Self-pay

## 2022-07-22 ENCOUNTER — Emergency Department (HOSPITAL_COMMUNITY): Payer: Self-pay

## 2022-07-22 ENCOUNTER — Encounter (HOSPITAL_COMMUNITY): Payer: Self-pay

## 2022-07-22 DIAGNOSIS — R0602 Shortness of breath: Secondary | ICD-10-CM | POA: Insufficient documentation

## 2022-07-22 DIAGNOSIS — R002 Palpitations: Secondary | ICD-10-CM | POA: Insufficient documentation

## 2022-07-22 DIAGNOSIS — N9489 Other specified conditions associated with female genital organs and menstrual cycle: Secondary | ICD-10-CM | POA: Insufficient documentation

## 2022-07-22 DIAGNOSIS — B9689 Other specified bacterial agents as the cause of diseases classified elsewhere: Secondary | ICD-10-CM | POA: Insufficient documentation

## 2022-07-22 DIAGNOSIS — R0600 Dyspnea, unspecified: Secondary | ICD-10-CM | POA: Insufficient documentation

## 2022-07-22 DIAGNOSIS — R110 Nausea: Secondary | ICD-10-CM | POA: Insufficient documentation

## 2022-07-22 DIAGNOSIS — R519 Headache, unspecified: Secondary | ICD-10-CM | POA: Insufficient documentation

## 2022-07-22 DIAGNOSIS — N390 Urinary tract infection, site not specified: Secondary | ICD-10-CM | POA: Insufficient documentation

## 2022-07-22 LAB — BASIC METABOLIC PANEL
Anion gap: 7 (ref 5–15)
BUN: 13 mg/dL (ref 6–20)
CO2: 23 mmol/L (ref 22–32)
Calcium: 9.2 mg/dL (ref 8.9–10.3)
Chloride: 110 mmol/L (ref 98–111)
Creatinine, Ser: 0.63 mg/dL (ref 0.44–1.00)
GFR, Estimated: >60 mL/min (ref >60)
Glucose, Bld: 97 mg/dL (ref 70–99)
Potassium: 3.9 mmol/L (ref 3.5–5.1)
Sodium: 140 mmol/L (ref 135–145)

## 2022-07-22 LAB — CBC
HCT: 40.5 % (ref 36.0–46.0)
Hemoglobin: 13.3 g/dL (ref 12.0–15.0)
MCH: 29.5 pg (ref 26.0–34.0)
MCHC: 32.8 g/dL (ref 30.0–36.0)
MCV: 89.8 fL (ref 80.0–100.0)
Platelets: 333 10*3/uL (ref 150–400)
RBC: 4.51 MIL/uL (ref 3.87–5.11)
RDW: 13.1 % (ref 11.5–15.5)
WBC: 7.5 10*3/uL (ref 4.0–10.5)
nRBC: 0 % (ref 0.0–0.2)

## 2022-07-22 LAB — I-STAT BETA HCG BLOOD, ED (MC, WL, AP ONLY): I-stat hCG, quantitative: <5 m[IU]/mL (ref <5)

## 2022-07-22 NOTE — ED Provider Triage Note (Signed)
Emergency Medicine Provider Triage Evaluation Note  Victoria Orozco , a 31 y.o. female  was evaluated in triage.  Pt complains of headache. Started a week ago. Pain extends to the back of the neck. Not the worse headache she has ever had. States that about  2 years ago she had a similar headache that required "draining of spinal fluid". Denies fever and nuchal rigidity. Also endorsing dysuria for the past 2 wks.   Review of Systems  Positive: See above Negative: See above  Physical Exam  BP 132/83   Pulse (!) 58   Temp 98.3 F (36.8 C) (Oral)   Resp 16   Ht 5\' 3"  (1.6 m)   Wt 113.9 kg   SpO2 98%   BMI 44.46 kg/m  Gen:   Awake, no distress   Resp:  Normal effort  MSK:   Moves extremities without difficulty  Other:    Medical Decision Making  Medically screening exam initiated at 3:40 PM.  Appropriate orders placed.  Endia Moncur was informed that the remainder of the evaluation will be completed by another provider, this initial triage assessment does not replace that evaluation, and the importance of remaining in the ED until their evaluation is complete.  Work up initiated   Leda Roys, PA-C 07/22/22 1546

## 2022-07-22 NOTE — ED Triage Notes (Signed)
Pt reports pain to the back of her head for one week "the pain is just in one spot" associated with SOB. She is also reporting dysuria x 2 weeks.

## 2022-07-23 ENCOUNTER — Emergency Department (HOSPITAL_COMMUNITY): Payer: Self-pay

## 2022-07-23 LAB — URINALYSIS, ROUTINE W REFLEX MICROSCOPIC
Bilirubin Urine: NEGATIVE
Glucose, UA: NEGATIVE mg/dL
Hgb urine dipstick: NEGATIVE
Ketones, ur: NEGATIVE mg/dL
Nitrite: POSITIVE — AB
Protein, ur: NEGATIVE mg/dL
Specific Gravity, Urine: 1.027 (ref 1.005–1.030)
WBC, UA: >50 WBC/hpf — ABNORMAL HIGH (ref 0–5)
pH: 5 (ref 5.0–8.0)

## 2022-07-23 LAB — TROPONIN I (HIGH SENSITIVITY): Troponin I (High Sensitivity): <2 ng/L (ref <18)

## 2022-07-23 MED ORDER — LACTATED RINGERS IV BOLUS
500.0000 mL | Freq: Once | INTRAVENOUS | Status: AC
  Administered 2022-07-23: 500 mL via INTRAVENOUS

## 2022-07-23 MED ORDER — CEPHALEXIN 500 MG PO CAPS: 500.0000 mg | ORAL_CAPSULE | Freq: Three times a day (TID) | ORAL | 0 refills | Status: AC

## 2022-07-23 MED ORDER — PROCHLORPERAZINE EDISYLATE 10 MG/2ML IJ SOLN
10.0000 mg | Freq: Once | INTRAMUSCULAR | Status: AC
  Administered 2022-07-23: 10 mg via INTRAVENOUS
  Filled 2022-07-23: qty 2

## 2022-07-23 MED ORDER — DIPHENHYDRAMINE HCL 50 MG/ML IJ SOLN
25.0000 mg | Freq: Once | INTRAMUSCULAR | Status: AC
  Administered 2022-07-23: 25 mg via INTRAVENOUS
  Filled 2022-07-23: qty 1

## 2022-07-23 NOTE — ED Provider Notes (Signed)
[facility] EMERGENCY DEPARTMENT Provider Note   CSN: [id number] Arrival date & time: 07/22/22  1415     History  Chief Complaint  Patient presents with   Headache    Victoria Orozco is a 31 y.o. female.  HPI     31yo female who presents with concern for headache, nausea, dyspnea.  Headache began one week ago. Began slowly and worsening, is present in back of neck/back of heada.  WOrse with bright lights/loud sounds.  When headache started also began to have shortness of breath. Dysuria present for 2 weeks. No abdominal pain. No cough, chest pain. Did have palpitations intermittently. No fevers or chills.  Had LP previously for headache, not diagnosed with IIH but unknown if opening pressures checked.  Has not seen neurology.      Past Medical History:  Diagnosis Date   Gall stones    Medical history non-contributory    No pertinent past medical history      Home Medications Prior to Admission medications   Medication Sig Start Date End Date Taking? Authorizing Provider  cephALEXin (KEFLEX) 500 MG capsule Take 1 capsule (500 mg total) by mouth 3 (three) times daily for 7 days. 07/23/22 07/30/22 Yes Alvira Monday, MD  diphenhydrAMINE (BENADRYL) 25 MG tablet Take 1 tablet (25 mg total) by mouth at bedtime as needed for up to 15 doses for itching. 03/02/21   Trifan, Kermit Balo, MD  methocarbamol (ROBAXIN) 500 MG tablet Take 1 tablet (500 mg total) by mouth 2 (two) times daily as needed for muscle spasms. 07/17/21   Placido Sou, PA-C  naproxen (NAPROSYN) 375 MG tablet Take 1 tablet (375 mg total) by mouth 2 (two) times daily. 12/11/17   Aviva Kluver B, PA-C  oseltamivir (TAMIFLU) 75 MG capsule Take 1 capsule (75 mg total) by mouth every 12 (twelve) hours. 09/07/18   Raelyn Mora, CNM  Prenatal Vit-Fe Fumarate-FA (MULTIVITAMIN-PRENATAL) 27-0.8 MG TABS tablet Take 1 tablet by mouth daily at 12 noon.    [provider]   sulfamethoxazole-trimethoprim (BACTRIM DS,SEPTRA DS) 800-160 MG tablet Take 1 tablet by mouth 2 (two) times daily. 09/07/18   Raelyn Mora, CNM      Allergies    Patient has no known allergies.    Review of Systems   Review of Systems  Physical Exam Updated Vital Signs BP 124/68 (BP Location: Left Arm)   Pulse 68   Temp 98.4 F (36.9 C)   Resp 16   Ht 5\' 3"  (1.6 m)   Wt 113.9 kg   SpO2 100%   BMI 44.46 kg/m  Physical Exam Vitals and nursing note reviewed.  Constitutional:      General: She is not in acute distress.    Appearance: Normal appearance. She is well-developed. She is not ill-appearing or diaphoretic.  HENT:     Head: Normocephalic and atraumatic.  Eyes:     General: No visual field deficit.    Extraocular Movements: Extraocular movements intact.     Conjunctiva/sclera: Conjunctivae normal.     Pupils: Pupils are equal, round, and reactive to light.  Cardiovascular:     Rate and Rhythm: Normal rate and regular rhythm.     Pulses: Normal pulses.     Heart sounds: Normal heart sounds. No murmur heard.    No friction rub. No gallop.  Pulmonary:     Effort: Pulmonary effort is normal. No respiratory distress.     Breath sounds: Normal breath sounds. No wheezing or rales.  Abdominal:     General: There is no distension.     Palpations: Abdomen is soft.     Tenderness: There is no abdominal tenderness. There is no guarding.  Musculoskeletal:        General: No swelling or tenderness.     Cervical back: Normal range of motion.  Skin:    General: Skin is warm and dry.     Findings: No erythema or rash.  Neurological:     General: No focal deficit present.     Mental Status: She is alert and oriented to person, place, and time.     GCS: GCS eye subscore is 4. GCS verbal subscore is 5. GCS motor subscore is 6.     Cranial Nerves: Cranial nerves 2-12 are intact. No cranial nerve deficit, dysarthria or facial asymmetry.     Sensory: Sensation is intact. No  sensory deficit.     Motor: No weakness or tremor.     Coordination: Coordination normal. Finger-Nose-Finger Test normal.     Gait: Gait normal.     ED Results / Procedures / Treatments   Labs (all labs ordered are listed, but only abnormal results are displayed) Labs Reviewed  URINALYSIS, ROUTINE W REFLEX MICROSCOPIC - Abnormal; Notable for the following components:      Result Value   Color, Urine AMBER (*)    APPearance CLOUDY (*)    Nitrite POSITIVE (*)    Leukocytes,Ua LARGE (*)    WBC, UA >50 (*)    Bacteria, UA MANY (*)    All other components within normal limits  BASIC METABOLIC PANEL  CBC  I-STAT BETA HCG BLOOD, ED ([facility], [facility], [facility] ONLY)  TROPONIN I (HIGH SENSITIVITY)  TROPONIN I (HIGH SENSITIVITY)    EKG EKG Interpretation  Date/Time:  Thursday July 23 2022 09:31:03 EST Ventricular Rate:  60 PR Interval:  196 QRS Duration: 90 QT Interval:  424 QTC Calculation: 424 R Axis:   5 Text Interpretation: Normal sinus rhythm with sinus arrhythmia Minimal voltage criteria for LVH, may be normal variant ( R in aVL ) Borderline ECG When compared with ECG of 02-Mar-2021 08:01, No significant change since last tracing Confirmed by Alvira Monday ([id number]) on 07/23/2022 9:40:56 AM  Radiology DG Chest 2 View  Result Date: 07/23/2022 CLINICAL DATA:  Shortness of breath EXAM: CHEST - 2 VIEW COMPARISON:  03/02/2021 FINDINGS: Upper normal heart size. Mediastinal contours and pulmonary vascularity normal. Lungs clear. No infiltrate, pleural effusion, or pneumothorax. Osseous structures unremarkable. IMPRESSION: Normal exam. Electronically Signed   By: Ulyses Southward M.D.   On: 07/23/2022 09:23    Procedures Procedures    Medications Ordered in ED Medications  prochlorperazine (COMPAZINE) injection 10 mg (10 mg Intravenous Given 07/23/22 0904)  diphenhydrAMINE (BENADRYL) injection 25 mg (25 mg Intravenous Given 07/23/22 0904)  lactated ringers bolus 500 mL (0 mLs Intravenous  Stopped 07/23/22 1033)    ED Course/ Medical Decision Making/ A&P                              30yo female who presents with concern for headache, nausea, dyspnea.  Regarding headache: No fever, doubt meningitis. CT head completed shows no ICH or other acute abnormalities. Doubt occult SAH given slow onset.  Some migrainous component, some pain with movement and radiation to neck and may be tension headache.  Do not see indication for surgery or admission. Recommend PCP follow up. HA improved after  headache cocktail  Reports at the same time developed dyspnea. Dyspnea improved now. ECG with normal sinus rhythm.  CXR without pneumonia, pneumothorax. Troponin negative, doubt ACS.  Doubt PE, perc negative, symptoms developed in setting of headache.  Has had palpitations, ecg without abnormalities.   Dysuria for 2 weeks-UA consistent with likely infection with greater than 50 WBC. Will treat with keflex. Patient discharged in stable condition with understanding of reasons to return.         Final Clinical Impression(s) / ED Diagnoses Final diagnoses:  Urinary tract infection without hematuria, site unspecified  Acute nonintractable headache, unspecified headache type  Palpitations  Dyspnea, unspecified type    Rx / DC Orders ED Discharge Orders          Ordered    cephALEXin (KEFLEX) 500 MG capsule  3 times daily        07/23/22 1045              Alvira Monday, MD 07/24/22 2239

## 2022-07-23 NOTE — ED Notes (Signed)
Pt refusing RT-PCR testing

## 2022-09-20 ENCOUNTER — Other Ambulatory Visit: Payer: Self-pay

## 2022-09-20 ENCOUNTER — Emergency Department (HOSPITAL_COMMUNITY)
Admission: EM | Admit: 2022-09-20 | Discharge: 2022-09-20 | Disposition: A | Discharge status: Home / Self Care | Payer: Self-pay | Attending: Emergency Medicine | Admitting: Emergency Medicine

## 2022-09-20 DIAGNOSIS — B3731 Acute candidiasis of vulva and vagina: Secondary | ICD-10-CM | POA: Insufficient documentation

## 2022-09-20 LAB — URINALYSIS, ROUTINE W REFLEX MICROSCOPIC
Bilirubin Urine: NEGATIVE
Glucose, UA: NEGATIVE mg/dL
Hgb urine dipstick: NEGATIVE
Ketones, ur: NEGATIVE mg/dL
Nitrite: NEGATIVE
Protein, ur: NEGATIVE mg/dL
Specific Gravity, Urine: 1.02 (ref 1.005–1.030)
pH: 5 (ref 5.0–8.0)

## 2022-09-20 LAB — PREGNANCY, URINE: Preg Test, Ur: NEGATIVE

## 2022-09-20 MED ORDER — FLUCONAZOLE 150 MG PO TABS
150.0000 mg | ORAL_TABLET | Freq: Once | ORAL | Status: AC
  Administered 2022-09-20: 150 mg via ORAL
  Filled 2022-09-20: qty 1

## 2022-09-20 NOTE — ED Provider Notes (Signed)
  [facility] Emergency Department Provider Note MRN:  [id number]  Arrival date & time: 09/20/22     Chief Complaint   Vaginal Itching   History of Present Illness   Victoria Orozco is a 32 y.o. year-old female presents to the ED with chief complaint of vaginal itching.  States that she just completed antibiotics for BV.  States that over the past few days she has been having some vaginal itching.  She denies pain.  Reports thick white vaginal discharge.  History provided by patient.   Review of Systems  Pertinent positive and negative review of systems noted in HPI.    Physical Exam   Vitals:   09/20/22 2018  BP: (!) 134/91  Pulse: 78  Resp: 16  Temp: 98.7 F (37.1 C)  SpO2: 99%    CONSTITUTIONAL:  well-appearing, NAD NEURO:  Alert and oriented x 3, CN 3-12 grossly intact EYES:  eyes equal and reactive ENT/NECK:  Supple, no stridor  CARDIO:  appears well-perfused  PULM:  No respiratory distress,  GI/GU:  non-distended,  MSK/SPINE:  No gross deformities, no edema, moves all extremities  SKIN:  no rash, atraumatic   *Additional and/or pertinent findings included in MDM below  Diagnostic and Interventional Summary    EKG Interpretation  Date/Time:    Ventricular Rate:    PR Interval:    QRS Duration:   QT Interval:    QTC Calculation:   R Axis:     Text Interpretation:         Labs Reviewed  URINALYSIS, ROUTINE W REFLEX MICROSCOPIC - Abnormal; Notable for the following components:      Result Value   APPearance HAZY (*)    Leukocytes,Ua MODERATE (*)    Bacteria, UA MANY (*)    All other components within normal limits  PREGNANCY, URINE    No orders to display    Medications  fluconazole (DIFLUCAN) tablet 150 mg (has no administration in time range)     Procedures  /  Critical Care Procedures  ED Course and Medical Decision Making  I have reviewed the triage vital signs, the nursing notes, and pertinent  available records from the EMR.  Social Determinants Affecting Complexity of Care: Patient has no clinically significant social determinants affecting this chief complaint..   ED Course:    Medical Decision Making Vaginal itching following antibiotics.  No pain.  Thick white discharge reported.  Hx consistent with vaginal candidiasis.  Will treat with diflucan.  Amount and/or Complexity of Data Reviewed Labs: ordered.    Details: Preg negative  Risk Prescription drug management.     Consultants: No consultations were needed in caring for this patient.   Treatment and Plan: Emergency department workup does not suggest an emergent condition requiring admission or immediate intervention beyond  what has been performed at this time. The patient is safe for discharge and has  been instructed to return immediately for worsening symptoms, change in  symptoms or any other concerns    Final Clinical Impressions(s) / ED Diagnoses     ICD-10-CM   1. Vaginal yeast infection  B37.31       ED Discharge Orders     None         Discharge Instructions Discussed with and Provided to Patient:   Discharge Instructions   None      Montine Circle, PA-C 09/20/22 2215    Tretha Sciara, MD 09/21/22 [phone]

## 2022-09-20 NOTE — ED Provider Triage Note (Signed)
Emergency Medicine Provider Triage Evaluation Note  Victoria Orozco , a 32 y.o. female  was evaluated in triage.  Pt complains of vaginal itching and burning and white discharge in the last 4 weeks.  Patient was evaluated a month ago, was diagnosed with BV, given metronidazole, fluconazole.  Patient reports she took her medications with improvement for a week then symptoms returned.  Patient was then given doxycycline and metronidazole but symptoms persist.  No fever nausea or vomiting, blood in stool or urine.  Review of Systems  Positive: As above Negative: As above  Physical Exam  BP (!) 134/91 (BP Location: Left Arm)   Pulse 78   Temp 98.7 F (37.1 C) (Oral)   Resp 16   SpO2 99%  Gen:   Awake, no distress   Resp:  Normal effort  MSK:   Moves extremities without difficulty  Other:    Medical Decision Making  Medically screening exam initiated at 8:29 PM.  Appropriate orders placed.  Dannielle Dalfonso was informed that the remainder of the evaluation will be completed by another provider, this initial triage assessment does not replace that evaluation, and the importance of remaining in the ED until their evaluation is complete.    Rex Kras, [place] 09/20/22 2300

## 2022-09-20 NOTE — ED Triage Notes (Signed)
Patient arrived with vaginal burning, itching with white discharge. States she has recently been treated for BV.

## 2022-09-28 ENCOUNTER — Emergency Department (HOSPITAL_COMMUNITY)
Admission: EM | Admit: 2022-09-28 | Discharge: 2022-09-29 | Disposition: A | Discharge status: Home / Self Care | Payer: Self-pay | Attending: Emergency Medicine | Admitting: Emergency Medicine

## 2022-09-28 ENCOUNTER — Encounter (HOSPITAL_COMMUNITY): Payer: Self-pay

## 2022-09-28 DIAGNOSIS — R102 Pelvic and perineal pain: Secondary | ICD-10-CM | POA: Insufficient documentation

## 2022-09-28 DIAGNOSIS — N898 Other specified noninflammatory disorders of vagina: Secondary | ICD-10-CM

## 2022-09-28 DIAGNOSIS — N7689 Other specified inflammation of vagina and vulva: Secondary | ICD-10-CM | POA: Insufficient documentation

## 2022-09-28 LAB — URINALYSIS, ROUTINE W REFLEX MICROSCOPIC
Bilirubin Urine: NEGATIVE
Glucose, UA: NEGATIVE mg/dL
Ketones, ur: NEGATIVE mg/dL
Nitrite: NEGATIVE
Protein, ur: NEGATIVE mg/dL
Specific Gravity, Urine: 1.024 (ref 1.005–1.030)
pH: 5 (ref 5.0–8.0)

## 2022-09-28 LAB — PREGNANCY, URINE: Preg Test, Ur: NEGATIVE

## 2022-09-28 NOTE — ED Provider Triage Note (Signed)
Emergency Medicine Provider Triage Evaluation Note  Victoria Orozco , a 32 y.o. female  was evaluated in triage.  Pt complains of vaginal redness, pain, and swelling for the past 2 weeks.  She also reports bloody and clear discharge.  She has been on OCP for the past month which are new.  She was recently tested and treated for BV and STD prophylaxis.  She took doxycycline, metronidazole, Bactrim and fluconazole.  Denies fevers, dysuria, abdominal pain.    Review of Systems  Positive: As above Negative: As above  Physical Exam  BP (!) 159/90   Pulse 65   Temp 98.2 F (36.8 C) (Oral)   Resp 16   SpO2 97%  Gen:   Awake, no distress   Resp:  Normal effort  MSK:   Moves extremities without difficulty  Other:    Medical Decision Making  Medically screening exam initiated at 10:24 PM.  Appropriate orders placed.  Victoria Orozco was informed that the remainder of the evaluation will be completed by another provider, this initial triage assessment does not replace that evaluation, and the importance of remaining in the ED until their evaluation is complete.     Theressa Stamps R, [place] 09/28/22 2232

## 2022-09-28 NOTE — ED Triage Notes (Signed)
Pt reports vaginal redness, pain and swelling for the past 2 weeks with bloody discharge.

## 2022-09-29 LAB — WET PREP, GENITAL
Clue Cells Wet Prep HPF POC: NONE SEEN
Sperm: NONE SEEN
Trich, Wet Prep: NONE SEEN
WBC, Wet Prep HPF POC: >=10 — AB (ref <10)
Yeast Wet Prep HPF POC: NONE SEEN

## 2022-09-29 MED ORDER — VAGISIL 5-2 % EX CREA: TOPICAL_CREAM | CUTANEOUS | 0 refills | Status: AC

## 2022-09-29 NOTE — ED Provider Notes (Signed)
[facility] Provider Note   CSN: [id number] Arrival date & time: 09/28/22  2208     History  Chief Complaint  Patient presents with   Vaginal Pain    Victoria Orozco is a 32 y.o. female.  The history is provided by the patient and medical records.  Vaginal Pain   32 y.o. F presenting to the ED with vaginal irritation.  States she has been having issues for the past 2 weeks since starting OCP's.  She reports had recent STD testing that was negative, was positive for BV and then developed yeast infection.  States after taking the flagyl her symptoms did improve but never fully went away.  She reports now she has a lot of vaginal irritation and burning, denies itching.  States worse after urinating or sitting for a long period of time.  She reports clear vaginal discharge, somewhat blood tinged at times.  She denies any new sexual partners.  She did recently change from liquid soap to powdered soap.    Home Medications Prior to Admission medications   Medication Sig Start Date End Date Taking? Authorizing Provider  diphenhydrAMINE (BENADRYL) 25 MG tablet Take 1 tablet (25 mg total) by mouth at bedtime as needed for up to 15 doses for itching. 03/02/21   Trifan, Carola Rhine, MD  methocarbamol (ROBAXIN) 500 MG tablet Take 1 tablet (500 mg total) by mouth 2 (two) times daily as needed for muscle spasms. 07/17/21   Rayna Sexton, PA-C  naproxen (NAPROSYN) 375 MG tablet Take 1 tablet (375 mg total) by mouth 2 (two) times daily. 12/11/17   Langston Masker B, PA-C  oseltamivir (TAMIFLU) 75 MG capsule Take 1 capsule (75 mg total) by mouth every 12 (twelve) hours. 09/07/18   Laury Deep, CNM  Prenatal Vit-Fe Fumarate-FA (MULTIVITAMIN-PRENATAL) 27-0.8 MG TABS tablet Take 1 tablet by mouth daily at 12 noon.    [provider]  sulfamethoxazole-trimethoprim (BACTRIM DS,SEPTRA DS) 800-160 MG tablet Take 1 tablet by mouth 2 (two) times  daily. 09/07/18   Laury Deep, CNM      Allergies    Patient has no known allergies.    Review of Systems   Review of Systems  Genitourinary:  Positive for vaginal pain.  All other systems reviewed and are negative.   Physical Exam Updated Vital Signs BP (!) 159/90   Pulse 65   Temp 98.2 F (36.8 C) (Oral)   Resp 16   SpO2 97%   Physical Exam Vitals and nursing note reviewed.  Constitutional:      Appearance: She is well-developed.  HENT:     Head: Normocephalic and atraumatic.  Eyes:     Conjunctiva/sclera: Conjunctivae normal.     Pupils: Pupils are equal, round, and reactive to light.  Cardiovascular:     Rate and Rhythm: Normal rate and regular rhythm.     Heart sounds: Normal heart sounds.  Pulmonary:     Effort: Pulmonary effort is normal.     Breath sounds: Normal breath sounds.  Abdominal:     General: Bowel sounds are normal.     Palpations: Abdomen is soft.  Genitourinary:    Comments: External vaginal exam only performed-- labia appear irritated without noted lesions or discrete rash Musculoskeletal:        General: Normal range of motion.     Cervical back: Normal range of motion.  Skin:    General: Skin is warm and dry.  Neurological:  Mental Status: She is alert and oriented to person, place, and time.     ED Results / Procedures / Treatments   Labs (all labs ordered are listed, but only abnormal results are displayed) Labs Reviewed  WET PREP, GENITAL - Abnormal; Notable for the following components:      Result Value   WBC, Wet Prep HPF POC >=10 (*)    All other components within normal limits  URINALYSIS, ROUTINE W REFLEX MICROSCOPIC - Abnormal; Notable for the following components:   APPearance HAZY (*)    Hgb urine dipstick LARGE (*)    Leukocytes,Ua LARGE (*)    Bacteria, UA RARE (*)    All other components within normal limits  PREGNANCY, URINE    EKG None  Radiology No results found.  Procedures Procedures     Medications Ordered in ED Medications - No data to display  ED Course/ Medical Decision Making/ A&P                             Medical Decision Making Amount and/or Complexity of Data Reviewed Labs: ordered. ECG/medicine tests: ordered and independent interpretation performed.  Risk OTC drugs.   32 year old female presenting to the ED with vaginal irritation.  Recently had STD testing that was negative, however was positive for BV and yeast.  She was treated with Diflucan and Flagyl.  States symptoms somewhat improved but never fully resolved.  Reports more so irritation to external vaginal area.  She did recently change her soap so this may be contributing.  On external exam she does seem to have some irritation to the labia but there are no open lesions or sores.  She does not have any discrete rash.  Repeat wet prep without recurrent yeast or BV today.  This may be from change in soap or other irritant.  Advised to use gentle soap such as dove/ivory/etc.  Will have her use vagisil PRN, can follow-up with PCP.  Return here for new concerns.    Final Clinical Impression(s) / ED Diagnoses Final diagnoses:  Vaginal irritation    Rx / DC Orders ED Discharge Orders          Ordered    Benzocaine-Resorcinol (VAGISIL) 5-2 % CREA        09/29/22 0110              Larene Pickett, PA-C 09/29/22 0235    Merryl Hacker, MD 09/29/22 [phone]

## 2022-09-29 NOTE — Discharge Instructions (Addendum)
Tests today did not show any BV or yeast. May want to make sure to use gentle soap in shower to prevent further irritation (dove, ivory, etc). Can use the topical cream if needed/if irritation persists. Follow-up with your primary care doctor. Return here for new concerns.

## 2022-10-05 ENCOUNTER — Encounter (HOSPITAL_COMMUNITY): Payer: Self-pay

## 2022-10-05 ENCOUNTER — Other Ambulatory Visit: Payer: Self-pay

## 2022-10-05 ENCOUNTER — Emergency Department (HOSPITAL_COMMUNITY)
Admission: EM | Admit: 2022-10-05 | Discharge: 2022-10-05 | Disposition: A | Discharge status: Home / Self Care | Payer: Self-pay | Attending: Student | Admitting: Student

## 2022-10-05 DIAGNOSIS — B3731 Acute candidiasis of vulva and vagina: Secondary | ICD-10-CM | POA: Insufficient documentation

## 2022-10-05 LAB — URINALYSIS, ROUTINE W REFLEX MICROSCOPIC
Bilirubin Urine: NEGATIVE
Glucose, UA: NEGATIVE mg/dL
Ketones, ur: NEGATIVE mg/dL
Leukocytes,Ua: NEGATIVE
Nitrite: NEGATIVE
Protein, ur: NEGATIVE mg/dL
Specific Gravity, Urine: 1.024 (ref 1.005–1.030)
pH: 5 (ref 5.0–8.0)

## 2022-10-05 LAB — WET PREP, GENITAL
Clue Cells Wet Prep HPF POC: NONE SEEN
Sperm: NONE SEEN
Trich, Wet Prep: NONE SEEN
WBC, Wet Prep HPF POC: <10 (ref <10)

## 2022-10-05 LAB — PREGNANCY, URINE: Preg Test, Ur: NEGATIVE

## 2022-10-05 LAB — CBG MONITORING, ED: Glucose-Capillary: 120 mg/dL — ABNORMAL HIGH (ref 70–99)

## 2022-10-05 MED ORDER — FLUCONAZOLE 150 MG PO TABS: ORAL_TABLET | ORAL | 0 refills | Status: DC

## 2022-10-05 NOTE — Discharge Instructions (Addendum)
Try using monistat cream to relieve symptoms

## 2022-10-05 NOTE — ED Provider Triage Note (Signed)
Emergency Medicine Provider Triage Evaluation Note  Victoria Orozco , a 32 y.o. female  was evaluated in triage.  Pt complains of vaginal discharge   Review of Systems  Positive: Vaginal burning  Negative: Fever   Physical Exam  BP 132/82   Pulse 66   Temp 97.7 F (36.5 C) (Oral)   Resp 17   Ht '5\' 3"'$  (1.6 m)   Wt 113.9 kg   LMP  (LMP Unknown)   SpO2 100%   BMI 44.46 kg/m  Gen:   Awake, no distress   Resp:  Normal effort  MSK:   Moves extremities without difficulty  Other:    Medical Decision Making  Medically screening exam initiated at 9:18 PM.  Appropriate orders placed.  Victoria Orozco was informed that the remainder of the evaluation will be completed by another provider, this initial triage assessment does not replace that evaluation, and the importance of remaining in the ED until their evaluation is complete.     Fransico Meadow, [place] 10/05/22 2119

## 2022-10-05 NOTE — ED Triage Notes (Signed)
Pt reports vaginal itching, redness, and pain x2 months. Seen multiple times for same with no relief.

## 2022-10-05 NOTE — ED Provider Notes (Signed)
[facility] EMERGENCY DEPARTMENT AT [facility] Provider Note   CSN: [id number] Arrival date & time: 10/05/22  2051     History  Chief Complaint  Patient presents with   Vaginal Itching    Victoria Orozco is a 32 y.o. female.  Pt complains of a vaginal discharge and vaginal burning.  Pt reports multiple recent medications for the same.  Pt has been on Bactrim, metronidazole, diflucan, and doxycycline for the same.    The history is provided by the patient. No language interpreter was used.  Vaginal Itching  Vaginal Discharge Quality:  Thin Severity:  Moderate Timing:  Constant Progression:  Worsening Chronicity:  New Relieved by:  Nothing Worsened by:  Nothing Ineffective treatments:  None tried Associated symptoms: vaginal itching        Home Medications Prior to Admission medications   Medication Sig Start Date End Date Taking? Authorizing Provider  fluconazole (DIFLUCAN) 150 MG tablet One tablet a day for 3 days 10/05/22  Yes Fransico Meadow, PA-C  Benzocaine-Resorcinol (VAGISIL) 5-2 % CREA Apply topically as needed for genital irritation. 09/29/22   Larene Pickett, PA-C  diphenhydrAMINE (BENADRYL) 25 MG tablet Take 1 tablet (25 mg total) by mouth at bedtime as needed for up to 15 doses for itching. 03/02/21   Trifan, Carola Rhine, MD  methocarbamol (ROBAXIN) 500 MG tablet Take 1 tablet (500 mg total) by mouth 2 (two) times daily as needed for muscle spasms. 07/17/21   Rayna Sexton, PA-C  naproxen (NAPROSYN) 375 MG tablet Take 1 tablet (375 mg total) by mouth 2 (two) times daily. 12/11/17   Langston Masker B, PA-C  oseltamivir (TAMIFLU) 75 MG capsule Take 1 capsule (75 mg total) by mouth every 12 (twelve) hours. 09/07/18   Laury Deep, CNM  Prenatal Vit-Fe Fumarate-FA (MULTIVITAMIN-PRENATAL) 27-0.8 MG TABS tablet Take 1 tablet by mouth daily at 12 noon.    [provider]  sulfamethoxazole-trimethoprim (BACTRIM DS,SEPTRA DS) 800-160 MG tablet  Take 1 tablet by mouth 2 (two) times daily. 09/07/18   Laury Deep, CNM      Allergies    Patient has no known allergies.    Review of Systems   Review of Systems  Genitourinary:  Positive for vaginal discharge.  All other systems reviewed and are negative.   Physical Exam Updated Vital Signs BP 132/80   Pulse 66   Temp 97.7 F (36.5 C) (Oral)   Resp 16   Ht '5\' 3"'$  (1.6 m)   Wt 113.9 kg   LMP  (LMP Unknown)   SpO2 99%   BMI 44.46 kg/m  Physical Exam Constitutional:      Appearance: She is well-developed.  HENT:     Head: Normocephalic and atraumatic.  Eyes:     Conjunctiva/sclera: Conjunctivae normal.     Pupils: Pupils are equal, round, and reactive to light.  Cardiovascular:     Rate and Rhythm: Normal rate.  Abdominal:     Palpations: Abdomen is soft.     Tenderness: There is no abdominal tenderness.  Genitourinary:    Comments: Vaginal discharge,  Thick white,  Adnexa no masses,  Cervix nontender Musculoskeletal:        General: Normal range of motion.     Cervical back: Normal range of motion and neck supple.  Skin:    General: Skin is warm.     ED Results / Procedures / Treatments   Labs (all labs ordered are listed, but only abnormal results are displayed) Labs  Reviewed  WET PREP, GENITAL - Abnormal; Notable for the following components:      Result Value   Yeast Wet Prep HPF POC PRESENT (*)    All other components within normal limits  URINALYSIS, ROUTINE W REFLEX MICROSCOPIC - Abnormal; Notable for the following components:   Hgb urine dipstick MODERATE (*)    Bacteria, UA RARE (*)    All other components within normal limits  CBG MONITORING, ED - Abnormal; Notable for the following components:   Glucose-Capillary 120 (*)    All other components within normal limits  PREGNANCY, URINE  GC/CHLAMYDIA PROBE AMP ([place]) NOT AT [facility]    EKG None  Radiology No results found.  Procedures Procedures    Medications Ordered in  ED Medications - No data to display  ED Course/ Medical Decision Making/ A&P                             Medical Decision Making Pt complains of vaginal irritation after multiple antibiotics.  Amount and/or Complexity of Data Reviewed Labs: ordered. Decision-making details documented in ED Course.    Details: Ua no acute  Wet prep yeast   Risk Prescription drug management. Risk Details: Pt given rx for diflucan.  I advised pt to try monistat topical            Final Clinical Impression(s) / ED Diagnoses Final diagnoses:  Yeast vaginitis    Rx / DC Orders ED Discharge Orders          Ordered    fluconazole (DIFLUCAN) 150 MG tablet        10/05/22 2244           An After Visit Summary was printed and given to the patient.    Sidney Ace 10/05/22 2323    Teressa Lower, MD 10/06/22 [phone]

## 2022-10-06 LAB — GC/CHLAMYDIA PROBE AMP (~~LOC~~) NOT AT ARMC
Chlamydia: NEGATIVE
Comment: NEGATIVE
Comment: NORMAL
Neisseria Gonorrhea: NEGATIVE

## 2022-10-09 ENCOUNTER — Emergency Department (HOSPITAL_COMMUNITY)
Admission: EM | Admit: 2022-10-09 | Discharge: 2022-10-09 | Disposition: A | Discharge status: Home / Self Care | Payer: Self-pay | Attending: Emergency Medicine | Admitting: Emergency Medicine

## 2022-10-09 ENCOUNTER — Encounter (HOSPITAL_COMMUNITY): Payer: Self-pay

## 2022-10-09 ENCOUNTER — Other Ambulatory Visit: Payer: Self-pay

## 2022-10-09 DIAGNOSIS — N898 Other specified noninflammatory disorders of vagina: Secondary | ICD-10-CM | POA: Insufficient documentation

## 2022-10-09 DIAGNOSIS — R8290 Unspecified abnormal findings in urine: Secondary | ICD-10-CM | POA: Insufficient documentation

## 2022-10-09 LAB — URINALYSIS, ROUTINE W REFLEX MICROSCOPIC
Bilirubin Urine: NEGATIVE
Glucose, UA: NEGATIVE mg/dL
Ketones, ur: NEGATIVE mg/dL
Nitrite: NEGATIVE
Protein, ur: NEGATIVE mg/dL
Specific Gravity, Urine: 1.015 (ref 1.005–1.030)
pH: 5 (ref 5.0–8.0)

## 2022-10-09 LAB — WET PREP, GENITAL
Clue Cells Wet Prep HPF POC: NONE SEEN
Sperm: NONE SEEN
Trich, Wet Prep: NONE SEEN
WBC, Wet Prep HPF POC: NONE SEEN — AB (ref <10)
Yeast Wet Prep HPF POC: NONE SEEN

## 2022-10-09 LAB — PREGNANCY, URINE: Preg Test, Ur: NEGATIVE

## 2022-10-09 NOTE — ED Triage Notes (Signed)
States that she was here 4 days ago and diagnosed with a yeast infection, she states she is still having burning in her vagina, and she doesn't think the medication is working. Also states she has a scant about of bleeding

## 2022-10-09 NOTE — ED Provider Notes (Signed)
[facility] Emergency Department Provider Note MRN:  [id number]  Arrival date & time: 10/09/22     Chief Complaint   Vaginitis   History of Present Illness   Victoria Orozco is a 32 y.o. year-old female presents to the ED with chief complaint of burning in the vaginal area.  States that she is having some discharge with blood.  Denies any belly pain.  Denies fever or chills.  States that it only hurts around the outside of her vagina.  Reports symptoms have been going on for 2 months.  She has been seen several times for the same but hasn't improved.    History provided by patient.   Review of Systems  Pertinent positive and negative review of systems noted in HPI.    Physical Exam   Vitals:   10/09/22 2122 10/09/22 2206  BP: 127/88   Pulse: 74   Resp: (!) 1 16  Temp: 98.7 F (37.1 C)   SpO2: 100%     CONSTITUTIONAL:  well-appearing, NAD NEURO:  Alert and oriented x 3, CN 3-12 grossly intact EYES:  eyes equal and reactive ENT/NECK:  Supple, no stridor  CARDIO:  normal rate, appears well-perfused  PULM:  No respiratory distress,  GI/GU:  non-distended, RN Jessica chaperone present for pelvic exam, no visible discharge or bleeding, no erythema, skin is shaven MSK/SPINE:  No gross deformities, no edema, moves all extremities  SKIN:  no rash, atraumatic   *Additional and/or pertinent findings included in MDM below  Diagnostic and Interventional Summary    EKG Interpretation  Date/Time:    Ventricular Rate:    PR Interval:    QRS Duration:   QT Interval:    QTC Calculation:   R Axis:     Text Interpretation:         Labs Reviewed  WET PREP, GENITAL - Abnormal; Notable for the following components:      Result Value   WBC, Wet Prep HPF POC NONE SEEN (*)    All other components within normal limits  URINALYSIS, ROUTINE W REFLEX MICROSCOPIC - Abnormal; Notable for the following components:   Hgb urine dipstick MODERATE (*)     Leukocytes,Ua SMALL (*)    Bacteria, UA RARE (*)    All other components within normal limits  PREGNANCY, URINE  HIV ANTIBODY (ROUTINE TESTING W REFLEX)  GC/CHLAMYDIA PROBE AMP ([place]) NOT AT [facility]    No orders to display    Medications - No data to display   Procedures  /  Critical Care Procedures  ED Course and Medical Decision Making  I have reviewed the triage vital signs, the nursing notes, and pertinent available records from the EMR.  Social Determinants Affecting Complexity of Care: Patient has no clinically significant social determinants affecting this chief complaint..   ED Course:    Medical Decision Making Patient with burning in the vaginal area.  Will check swabs and do pelvic exam.  Pelvic exam is nonconcerning.  I do not see any erythema, I do not see discharge or bleeding.  She does have shaven skin, this could be contributing to the burning sensation if she is getting a local dermatitis or razor burn.  I have encouraged her to stop shaving.  Labs are pending, but I suspect that the next step is for OB/GYN follow-up as she has had 4 emergency department visits and continues to have symptoms.  Amount and/or Complexity of Data Reviewed Labs: ordered.    Details:  UA abnormal, but inconsistent with UTI and not reported dysuria Preg negative No clue cells, no trich, no yeast     Consultants: No consultations were needed in caring for this patient.   Treatment and Plan: Emergency department workup does not suggest an emergent condition requiring admission or immediate intervention beyond  what has been performed at this time. The patient is safe for discharge and has  been instructed to return immediately for worsening symptoms, change in  symptoms or any other concerns    Final Clinical Impressions(s) / ED Diagnoses     ICD-10-CM   1. Vaginal irritation  N89.8     2. Abnormal urinalysis  R82.90       ED Discharge Orders     None          Discharge Instructions Discussed with and Provided to Patient:     Discharge Instructions      You need to follow-up with the OBGYN.  I recommend that you stop shaving.  If you develop painful urination, fever, severe abdominal pain, return to the ER.  Otherwise, please see your doctor.       Montine Circle, PA-C 10/09/22 2338    Davonna Belling, MD 10/09/22 [phone]

## 2022-10-09 NOTE — Discharge Instructions (Signed)
You need to follow-up with the OBGYN.  I recommend that you stop shaving.  If you develop painful urination, fever, severe abdominal pain, return to the ER.  Otherwise, please see your doctor.

## 2022-10-10 LAB — HIV ANTIBODY (ROUTINE TESTING W REFLEX): HIV Screen 4th Generation wRfx: NONREACTIVE

## 2022-10-12 LAB — GC/CHLAMYDIA PROBE AMP (~~LOC~~) NOT AT ARMC
Chlamydia: NEGATIVE
Comment: NEGATIVE
Comment: NORMAL
Neisseria Gonorrhea: NEGATIVE

## 2023-04-10 ENCOUNTER — Emergency Department (HOSPITAL_COMMUNITY)
Admission: EM | Admit: 2023-04-10 | Discharge: 2023-04-10 | Disposition: A | Discharge status: Home / Self Care | Payer: Self-pay | Attending: Emergency Medicine | Admitting: Emergency Medicine

## 2023-04-10 ENCOUNTER — Encounter (HOSPITAL_COMMUNITY): Payer: Self-pay | Admitting: Emergency Medicine

## 2023-04-10 DIAGNOSIS — B3731 Acute candidiasis of vulva and vagina: Secondary | ICD-10-CM | POA: Insufficient documentation

## 2023-04-10 LAB — PREGNANCY, URINE: Preg Test, Ur: NEGATIVE

## 2023-04-10 MED ORDER — FLUCONAZOLE 150 MG PO TABS: 150.0000 mg | ORAL_TABLET | Freq: Once | ORAL | Status: DC

## 2023-04-10 MED ORDER — FLUCONAZOLE 150 MG PO TABS: 150.0000 mg | ORAL_TABLET | Freq: Every day | ORAL | 0 refills | Status: DC

## 2023-04-10 MED ORDER — FLUCONAZOLE 150 MG PO TABS
150.0000 mg | ORAL_TABLET | Freq: Once | ORAL | Status: AC
  Administered 2023-04-10: 150 mg via ORAL
  Filled 2023-04-10: qty 1

## 2023-04-10 MED ORDER — NYSTATIN 100000 UNIT/GM EX CREA: TOPICAL_CREAM | CUTANEOUS | 0 refills | Status: AC

## 2023-04-10 NOTE — ED Triage Notes (Signed)
Pt vaginal itching that started yesterday. Pt currently taking antibiotic for UTI.

## 2023-04-10 NOTE — ED Provider Notes (Signed)
[place] EMERGENCY DEPARTMENT AT [facility] Provider Note   CSN: [id number] Arrival date & time: 04/10/23  1843     History Chief Complaint  Patient presents with   Vaginal Itching    Victoria Orozco is a 32 y.o. female presents the emergency room today for evaluation of vaginal itching for the past few days after finishing Bactrim for UTI.  Patient reports that she no longer has any dysuria, urgency, or frequency but has had vaginal itching with some white thick discharge.  She reports that she has been scratching and noticed some blood whenever she scratches as well.  She reports that she does get frequent yeast infections and this does seem similar to her previous ones.  She is not having any abdominal pain or fevers.  She does not have any concern for any STDs given that she is in a monogamous married relationship. NKDA.   Vaginal Itching Pertinent negatives include no abdominal pain.       Home Medications Prior to Admission medications   Medication Sig Start Date End Date Taking? Authorizing Provider  fluconazole (DIFLUCAN) 150 MG tablet Take 1 tablet (150 mg total) by mouth daily. 04/10/23  Yes Achille Rich, PA-C  nystatin cream (MYCOSTATIN) Apply to affected area 2 times daily 04/10/23  Yes Achille Rich, PA-C  Benzocaine-Resorcinol (VAGISIL) 5-2 % CREA Apply topically as needed for genital irritation. 09/29/22   Garlon Hatchet, PA-C  diphenhydrAMINE (BENADRYL) 25 MG tablet Take 1 tablet (25 mg total) by mouth at bedtime as needed for up to 15 doses for itching. 03/02/21   Trifan, Kermit Balo, MD  methocarbamol (ROBAXIN) 500 MG tablet Take 1 tablet (500 mg total) by mouth 2 (two) times daily as needed for muscle spasms. 07/17/21   Placido Sou, PA-C  naproxen (NAPROSYN) 375 MG tablet Take 1 tablet (375 mg total) by mouth 2 (two) times daily. 12/11/17   Aviva Kluver B, PA-C  oseltamivir (TAMIFLU) 75 MG capsule Take 1 capsule (75 mg total) by mouth every 12  (twelve) hours. 09/07/18   Raelyn Mora, CNM  Prenatal Vit-Fe Fumarate-FA (MULTIVITAMIN-PRENATAL) 27-0.8 MG TABS tablet Take 1 tablet by mouth daily at 12 noon.    [provider]  sulfamethoxazole-trimethoprim (BACTRIM DS,SEPTRA DS) 800-160 MG tablet Take 1 tablet by mouth 2 (two) times daily. 09/07/18   Raelyn Mora, CNM      Allergies    Patient has no known allergies.    Review of Systems   Review of Systems  Constitutional:  Negative for chills and fever.  Gastrointestinal:  Negative for abdominal pain, nausea and vomiting.  Genitourinary:  Positive for vaginal discharge. Negative for dysuria, frequency, hematuria, urgency and vaginal bleeding.    Physical Exam Updated Vital Signs BP (!) 145/93 (BP Location: Left Arm)   Pulse 70   Temp 99 F (37.2 C) (Oral)   Resp 17   SpO2 98%  Physical Exam Vitals and nursing note reviewed. Exam conducted with a chaperone present Marchelle Folks, Charity fundraiser).  Constitutional:      General: She is not in acute distress.    Appearance: Normal appearance. She is not ill-appearing or toxic-appearing.  Eyes:     General: No scleral icterus. Pulmonary:     Effort: Pulmonary effort is normal. No respiratory distress.  Abdominal:     Tenderness: There is no abdominal tenderness. There is no guarding or rebound.  Genitourinary:    Comments: Vulvar irritation with excoriation marks. Thick white discharge present.  Skin:  General: Skin is warm and dry.  Neurological:     General: No focal deficit present.     Mental Status: She is alert. Mental status is at baseline.  Psychiatric:        Mood and Affect: Mood normal.     ED Results / Procedures / Treatments   Labs (all labs ordered are listed, but only abnormal results are displayed) Labs Reviewed  PREGNANCY, URINE    EKG None  Radiology No results found.  Procedures Procedures   Medications Ordered in ED Medications  fluconazole (DIFLUCAN) tablet 150 mg (has no administration  in time range)    ED Course/ Medical Decision Making/ A&P    Medical Decision Making Amount and/or Complexity of Data Reviewed Labs: ordered.   32 y.o. female presents to the ER for evaluation of vaginal itching. Differential diagnosis includes but is not limited to STD, yeast, cellulitis. Vital signs mildly elevated BP, otherwise unremarkable. Physical exam as noted above.   I independently reviewed and interpreted the patient's labs. Pregnancy test negative.  Vaginal irritation and thick white vaginal discharge present. She is not having any pelvic pain, abdominal pain, nausea, vomiting or fever. She has no concern for STDs, doubt PID. Will discharge home on second dose of diflucan and topical nystatin. Will need follow up with gynecology again.   We discussed the results of the labs/imaging. The plan is take nystatin and diflucan. We discussed strict return precautions and red flag symptoms. The patient verbalized their understanding and agrees to the plan. The patient is stable and being discharged home in good condition.  Portions of this report may have been transcribed using voice recognition software. Every effort was made to ensure accuracy; however, inadvertent computerized transcription errors may be present.   Final Clinical Impression(s) / ED Diagnoses Final diagnoses:  Yeast vaginitis    Rx / DC Orders ED Discharge Orders          Ordered    nystatin cream (MYCOSTATIN)        04/10/23 2138    fluconazole (DIFLUCAN) 150 MG tablet  Daily        04/10/23 2138              Achille Rich, PA-C 04/10/23 2146    Bethann Berkshire, MD 04/14/23 1144

## 2023-04-10 NOTE — Discharge Instructions (Addendum)
You were seen in the ER for evaluation of your vaginal itching. This is likely a yeast infection from recently finishing your antibiotic. We have given you a dose today. Please take your next dose on 04/13/23. You can apply the nystatin to the area and them apply Desitin on top. Please make sure the area is staying dry. Please follow up with gynecology. If you have any concerns, new or worsening symptoms, please return to the ER for re-evaluation.   Contact a health care provider if: You have a fever. Your symptoms go away and then return. Your symptoms do not get better with treatment. Your symptoms get worse. You have new symptoms. You develop blisters in or around your vagina. You have blood coming from your vagina and it is not your menstrual period. You develop pain in your abdomen

## 2023-05-12 ENCOUNTER — Other Ambulatory Visit: Payer: Self-pay

## 2023-05-12 ENCOUNTER — Emergency Department (HOSPITAL_COMMUNITY)
Admission: EM | Admit: 2023-05-12 | Discharge: 2023-05-12 | Disposition: A | Discharge status: Home / Self Care | Payer: Self-pay | Attending: Emergency Medicine | Admitting: Emergency Medicine

## 2023-05-12 ENCOUNTER — Encounter (HOSPITAL_COMMUNITY): Payer: Self-pay

## 2023-05-12 DIAGNOSIS — Z79899 Other long term (current) drug therapy: Secondary | ICD-10-CM | POA: Insufficient documentation

## 2023-05-12 DIAGNOSIS — I10 Essential (primary) hypertension: Secondary | ICD-10-CM | POA: Insufficient documentation

## 2023-05-12 DIAGNOSIS — R110 Nausea: Secondary | ICD-10-CM | POA: Insufficient documentation

## 2023-05-12 DIAGNOSIS — R519 Headache, unspecified: Secondary | ICD-10-CM | POA: Insufficient documentation

## 2023-05-12 LAB — CBC WITH DIFFERENTIAL/PLATELET
Abs Immature Granulocytes: 0.02 10*3/uL (ref 0.00–0.07)
Basophils Absolute: 0 10*3/uL (ref 0.0–0.1)
Basophils Relative: 0 %
Eosinophils Absolute: 0 10*3/uL (ref 0.0–0.5)
Eosinophils Relative: 1 %
HCT: 36.7 % (ref 36.0–46.0)
Hemoglobin: 12 g/dL (ref 12.0–15.0)
Immature Granulocytes: 0 %
Lymphocytes Relative: 32 %
Lymphs Abs: 1.9 10*3/uL (ref 0.7–4.0)
MCH: 29.4 pg (ref 26.0–34.0)
MCHC: 32.7 g/dL (ref 30.0–36.0)
MCV: 90 fL (ref 80.0–100.0)
Monocytes Absolute: 0.7 10*3/uL (ref 0.1–1.0)
Monocytes Relative: 12 %
Neutro Abs: 3.2 10*3/uL (ref 1.7–7.7)
Neutrophils Relative %: 55 %
Platelets: 249 10*3/uL (ref 150–400)
RBC: 4.08 MIL/uL (ref 3.87–5.11)
RDW: 13.2 % (ref 11.5–15.5)
WBC: 5.8 10*3/uL (ref 4.0–10.5)
nRBC: 0 % (ref 0.0–0.2)

## 2023-05-12 LAB — BASIC METABOLIC PANEL
Anion gap: 6 (ref 5–15)
BUN: 13 mg/dL (ref 6–20)
CO2: 23 mmol/L (ref 22–32)
Calcium: 8.6 mg/dL — ABNORMAL LOW (ref 8.9–10.3)
Chloride: 106 mmol/L (ref 98–111)
Creatinine, Ser: 0.69 mg/dL (ref 0.44–1.00)
GFR, Estimated: >60 mL/min (ref >60)
Glucose, Bld: 96 mg/dL (ref 70–99)
Potassium: 3.7 mmol/L (ref 3.5–5.1)
Sodium: 135 mmol/L (ref 135–145)

## 2023-05-12 LAB — HCG, SERUM, QUALITATIVE: Preg, Serum: NEGATIVE

## 2023-05-12 MED ORDER — ACETAMINOPHEN 500 MG PO TABS
1000.0000 mg | ORAL_TABLET | Freq: Once | ORAL | Status: AC
  Administered 2023-05-12: 1000 mg via ORAL
  Filled 2023-05-12: qty 2

## 2023-05-12 MED ORDER — SODIUM CHLORIDE 0.9 % IV BOLUS
1000.0000 mL | Freq: Once | INTRAVENOUS | Status: AC
  Administered 2023-05-12: 1000 mL via INTRAVENOUS

## 2023-05-12 MED ORDER — METOCLOPRAMIDE HCL 5 MG/ML IJ SOLN
10.0000 mg | Freq: Once | INTRAMUSCULAR | Status: AC
  Administered 2023-05-12: 10 mg via INTRAVENOUS
  Filled 2023-05-12: qty 2

## 2023-05-12 MED ORDER — DIPHENHYDRAMINE HCL 50 MG/ML IJ SOLN
12.5000 mg | Freq: Once | INTRAMUSCULAR | Status: AC
  Administered 2023-05-12: 12.5 mg via INTRAVENOUS
  Filled 2023-05-12: qty 1

## 2023-05-12 MED ORDER — SODIUM CHLORIDE 0.9 % IV SOLN: INTRAVENOUS | Status: DC

## 2023-05-12 NOTE — Discharge Instructions (Signed)
Your presentation is consistent with likely migraine headache and resolved with treatment, recommend you follow-up outpatient with your primary care provider and return for any severe worsening symptoms.

## 2023-05-12 NOTE — ED Provider Notes (Signed)
[place] EMERGENCY DEPARTMENT AT [facility] Provider Note   CSN: [id number] Arrival date & time: 05/12/23  1259     History  Chief Complaint  Patient presents with   Headache    Claudell Rhody is a 32 y.o. female.   Headache    32 year old female with medical history significant for migraine headaches who presents emergency department with roughly 2 days of a migraine headache.  The patient has tried Advil at home without relief.  She feels subjectively warm but has had no fevers at home.  She denies any neck stiffness or rigidity.  No falls or trauma, no vision changes.  She denies any light sensitivity or sensitivity but does endorse nausea and a throbbing headache that radiates from the top of her skull back.  Denies any facial droop, numbness, weakness.  Home Medications Prior to Admission medications   Medication Sig Start Date End Date Taking? Authorizing Provider  Benzocaine-Resorcinol (VAGISIL) 5-2 % CREA Apply topically as needed for genital irritation. 09/29/22   Garlon Hatchet, PA-C  diphenhydrAMINE (BENADRYL) 25 MG tablet Take 1 tablet (25 mg total) by mouth at bedtime as needed for up to 15 doses for itching. 03/02/21   Terald Sleeper, MD  fluconazole (DIFLUCAN) 150 MG tablet Take 1 tablet (150 mg total) by mouth daily. 04/10/23   Achille Rich, PA-C  methocarbamol (ROBAXIN) 500 MG tablet Take 1 tablet (500 mg total) by mouth 2 (two) times daily as needed for muscle spasms. 07/17/21   Placido Sou, PA-C  naproxen (NAPROSYN) 375 MG tablet Take 1 tablet (375 mg total) by mouth 2 (two) times daily. 12/11/17   Aviva Kluver B, PA-C  nystatin cream (MYCOSTATIN) Apply to affected area 2 times daily 04/10/23   Achille Rich, PA-C  oseltamivir (TAMIFLU) 75 MG capsule Take 1 capsule (75 mg total) by mouth every 12 (twelve) hours. 09/07/18   Raelyn Mora, CNM  Prenatal Vit-Fe Fumarate-FA (MULTIVITAMIN-PRENATAL) 27-0.8 MG TABS tablet Take 1 tablet by mouth  daily at 12 noon.    [provider]  sulfamethoxazole-trimethoprim (BACTRIM DS,SEPTRA DS) 800-160 MG tablet Take 1 tablet by mouth 2 (two) times daily. 09/07/18   Raelyn Mora, CNM      Allergies    Patient has no known allergies.    Review of Systems   Review of Systems  Neurological:  Positive for headaches.  All other systems reviewed and are negative.   Physical Exam Updated Vital Signs BP 109/69   Pulse 64   Temp 98.6 F (37 C) (Oral)   Resp 18   Ht 5\' 4"  (1.626 m)   Wt 113.4 kg   LMP 05/04/2023 (Exact Date)   SpO2 100%   BMI 42.91 kg/m  Physical Exam Vitals and nursing note reviewed.  Constitutional:      General: She is not in acute distress.    Appearance: She is well-developed.  HENT:     Head: Normocephalic and atraumatic.  Eyes:     Conjunctiva/sclera: Conjunctivae normal.  Neck:     Comments: No meningismus, intact range of motion of the head and neck without pain Cardiovascular:     Rate and Rhythm: Normal rate and regular rhythm.  Pulmonary:     Effort: Pulmonary effort is normal. No respiratory distress.     Breath sounds: Normal breath sounds.  Abdominal:     Palpations: Abdomen is soft.     Tenderness: There is no abdominal tenderness.  Musculoskeletal:  General: No swelling.     Cervical back: Neck supple.  Skin:    General: Skin is warm and dry.     Capillary Refill: Capillary refill takes less than 2 seconds.  Neurological:     Mental Status: She is alert.     Comments: MENTAL STATUS EXAM:    Orientation: Alert and oriented to person, place and time.  Memory: Cooperative, follows commands well.  Language: Speech is clear and language is normal.   CRANIAL NERVES:    CN 2 (Optic): Visual fields intact to confrontation.  CN 3,4,6 (EOM): Pupils equal and reactive to light. Full extraocular eye movement without nystagmus.  CN 5 (Trigeminal): Facial sensation is normal, no weakness of masticatory muscles.  CN 7 (Facial): No  facial weakness or asymmetry.  CN 8 (Auditory): Auditory acuity grossly normal.  CN 9,10 (Glossophar): The uvula is midline, the palate elevates symmetrically.  CN 11 (spinal access): Normal sternocleidomastoid and trapezius strength.  CN 12 (Hypoglossal): The tongue is midline. No atrophy or fasciculations.Marland Kitchen   MOTOR:  Muscle Strength: 5/5RUE, 5/5LUE, 5/5RLE, 5/5LLE.   COORDINATION:   No tremor.   SENSATION:   Intact to light touch all four extremities.     Psychiatric:        Mood and Affect: Mood normal.     ED Results / Procedures / Treatments   Labs (all labs ordered are listed, but only abnormal results are displayed) Labs Reviewed  BASIC METABOLIC PANEL - Abnormal; Notable for the following components:      Result Value   Calcium 8.6 (*)    All other components within normal limits  RESP PANEL BY RT-PCR (RSV, FLU A&B, COVID)  RVPGX2  HCG, SERUM, QUALITATIVE  CBC WITH DIFFERENTIAL/PLATELET    EKG None  Radiology No results found.  Procedures Procedures    Medications Ordered in ED Medications  sodium chloride 0.9 % bolus 1,000 mL (0 mLs Intravenous Stopped 05/12/23 1850)    And  0.9 %  sodium chloride infusion (0 mLs Intravenous Stopped 05/12/23 1906)  metoCLOPramide (REGLAN) injection 10 mg (10 mg Intravenous Given 05/12/23 1756)  diphenhydrAMINE (BENADRYL) injection 12.5 mg (12.5 mg Intravenous Given 05/12/23 1756)  acetaminophen (TYLENOL) tablet 1,000 mg (1,000 mg Oral Given 05/12/23 1756)    ED Course/ Medical Decision Making/ A&P                                 Medical Decision Making Amount and/or Complexity of Data Reviewed Labs: ordered.  Risk OTC drugs. Prescription drug management.    32 year old female with medical history significant for migraine headaches who presents emergency department with roughly 2 days of a migraine headache.  The patient has tried Advil at home without relief.  She feels subjectively warm but has had no fevers at  home.  She denies any neck stiffness or rigidity.  No falls or trauma, no vision changes.  She denies any light sensitivity or sensitivity but does endorse nausea and a throbbing headache that radiates from the top of her skull back.  Denies any facial droop, numbness, weakness.  Janete Quilling is a 32 y.o. female who presents with headache as per above.   On arrival, the patient was vitally stable, afebrile, not tachycardic or tachypneic, hemodynamically stable.  Currently she is awake, alert, GCS 15, HDS, and afebrile. Her exam is most notable for fully intact extraocular motions with bilaterally reactive pupils, no focal  neurologic deficits, no meningismus, and no temporal tenderness. There is no rash. The headache was not sudden onset or the worst headache of the patient's life. There is no visual deficit.  I am most concerned for a migraine headache versus tension type headache.  To further evaluate and risk stratify her, labs and imaging were obtained, which were significant for:  hCG normal, BMP and CBC unremarkable, patient refused COVID and influenza testing.   I do not think the patient has an aneurysm, intracranial bleed, mass lesion, meningitis, temporal arteritis, stroke, cluster headache, idiopathic intracranial hypertension, cavernous sinus thrombosis, carbon monoxide toxicity, herpes zoster, carotid or vertebral artery dissection, or acute angle close glaucoma.  The access was obtained the patient was administered a migraine cocktail to include an IV fluid bolus, IV Reglan and Benadryl in addition to oral Tylenol.  On reassessment, the patient remained well appearing and was again able to ambulate without difficulty and did not have any focal neurologic deficits.  I believe the patient is stable for discharge given resolution of her symptoms..  We participated in shared decision making regarding continued observation in the ED versus discharge home for continued recovery  after receiving the above medications. She preferred to recover at home. I believe that this is safe and reasonable.  We have discussed the diagnosis and risks, and we agree with discharging home to follow-up with their primary doctor. We also discussed returning to the Emergency Department immediately if new or worsening symptoms occur. We have discussed the symptoms which are most concerning (e.g., changing or worsening pain, weakness, vomiting, fever, or abnormal sensation) that necessitate immediate return. I provided ED return precautions. The patient felt safe with this plan.  ED Medication Summary: Medications  sodium chloride 0.9 % bolus 1,000 mL (0 mLs Intravenous Stopped 05/12/23 1850)    And  0.9 %  sodium chloride infusion (0 mLs Intravenous Stopped 05/12/23 1906)  metoCLOPramide (REGLAN) injection 10 mg (10 mg Intravenous Given 05/12/23 1756)  diphenhydrAMINE (BENADRYL) injection 12.5 mg (12.5 mg Intravenous Given 05/12/23 1756)  acetaminophen (TYLENOL) tablet 1,000 mg (1,000 mg Oral Given 05/12/23 1756)     Final Clinical Impression(s) / ED Diagnoses Final diagnoses:  Acute nonintractable headache, unspecified headache type    Rx / DC Orders ED Discharge Orders     None         Ernie Avena, MD 05/12/23 1951

## 2023-05-12 NOTE — ED Triage Notes (Signed)
Pt complaining of headache that has been going on for apprx 2 days. Pt has tried Advil without relief, states she has a hx of these headaches. Denies any s/s of stroke.

## 2023-06-06 ENCOUNTER — Emergency Department (HOSPITAL_COMMUNITY)
Admission: EM | Admit: 2023-06-06 | Discharge: 2023-06-06 | Disposition: A | Discharge status: Home / Self Care | Payer: Self-pay | Attending: Emergency Medicine | Admitting: Emergency Medicine

## 2023-06-06 ENCOUNTER — Other Ambulatory Visit: Payer: Self-pay

## 2023-06-06 ENCOUNTER — Encounter (HOSPITAL_COMMUNITY): Payer: Self-pay

## 2023-06-06 DIAGNOSIS — N3000 Acute cystitis without hematuria: Secondary | ICD-10-CM | POA: Insufficient documentation

## 2023-06-06 DIAGNOSIS — R519 Headache, unspecified: Secondary | ICD-10-CM | POA: Insufficient documentation

## 2023-06-06 DIAGNOSIS — Z1152 Encounter for screening for COVID-19: Secondary | ICD-10-CM | POA: Insufficient documentation

## 2023-06-06 LAB — URINALYSIS, ROUTINE W REFLEX MICROSCOPIC
Bilirubin Urine: NEGATIVE
Glucose, UA: NEGATIVE mg/dL
Ketones, ur: NEGATIVE mg/dL
Nitrite: POSITIVE — AB
Protein, ur: NEGATIVE mg/dL
Specific Gravity, Urine: 1.012 (ref 1.005–1.030)
WBC, UA: >50 WBC/hpf (ref 0–5)
pH: 5 (ref 5.0–8.0)

## 2023-06-06 LAB — PREGNANCY, URINE: Preg Test, Ur: NEGATIVE

## 2023-06-06 LAB — RESP PANEL BY RT-PCR (RSV, FLU A&B, COVID)  RVPGX2
Influenza A by PCR: NEGATIVE
Influenza B by PCR: NEGATIVE
Resp Syncytial Virus by PCR: NEGATIVE
SARS Coronavirus 2 by RT PCR: NEGATIVE

## 2023-06-06 MED ORDER — CEPHALEXIN 500 MG PO CAPS: 500.0000 mg | ORAL_CAPSULE | Freq: Four times a day (QID) | ORAL | 0 refills | Status: DC

## 2023-06-06 MED ORDER — FLUCONAZOLE 200 MG PO TABS: 200.0000 mg | ORAL_TABLET | Freq: Once | ORAL | 0 refills | Status: AC

## 2023-06-06 MED ORDER — CEPHALEXIN 500 MG PO CAPS: 500.0000 mg | ORAL_CAPSULE | Freq: Four times a day (QID) | ORAL | 0 refills | Status: AC

## 2023-06-06 MED ORDER — KETOROLAC TROMETHAMINE 15 MG/ML IJ SOLN
15.0000 mg | Freq: Once | INTRAMUSCULAR | Status: AC
  Administered 2023-06-06: 15 mg via INTRAVENOUS
  Filled 2023-06-06: qty 1

## 2023-06-06 MED ORDER — CEPHALEXIN 500 MG PO CAPS
500.0000 mg | ORAL_CAPSULE | Freq: Once | ORAL | Status: AC
  Administered 2023-06-06: 500 mg via ORAL
  Filled 2023-06-06: qty 1

## 2023-06-06 MED ORDER — METOCLOPRAMIDE HCL 5 MG/ML IJ SOLN
10.0000 mg | Freq: Once | INTRAMUSCULAR | Status: AC
  Administered 2023-06-06: 10 mg via INTRAVENOUS
  Filled 2023-06-06: qty 2

## 2023-06-06 MED ORDER — FLUCONAZOLE 200 MG PO TABS: 200.0000 mg | ORAL_TABLET | Freq: Once | ORAL | 0 refills | Status: DC

## 2023-06-06 MED ORDER — ACETAMINOPHEN 325 MG PO TABS
650.0000 mg | ORAL_TABLET | Freq: Once | ORAL | Status: AC
  Administered 2023-06-06: 650 mg via ORAL
  Filled 2023-06-06: qty 2

## 2023-06-06 NOTE — ED Triage Notes (Signed)
Pt arrived reporting bad headache, nausea and fever for 3 days. Has been taking OTC meds for fever. Denies any other symptoms. NAD noted in triage

## 2023-06-06 NOTE — Discharge Instructions (Signed)
You were seen in the emergency department for your fever.  You did test positive for urinary tract infection and I have given you a course of antibiotics and you should complete this as prescribed.  You can take Tylenol and Motrin as needed for fevers or headaches.  I have given you fluconazole that you can take as needed for any vaginal itching after the antibiotics.  You should follow-up with your primary care doctor in the next few days to have your symptoms rechecked.  You should return to the emergency department if you are continuing to have fevers in the next 24 to 48 hours despite the antibiotics, you have severe abdominal or back pain or if you have any other new or concerning symptoms.

## 2023-06-06 NOTE — ED Provider Notes (Signed)
[place] EMERGENCY DEPARTMENT AT [facility] Provider Note   CSN: [id number] Arrival date & time: 06/06/23  1402     History  Chief Complaint  Patient presents with   Fever   Nausea   Headache    Victoria Orozco is a 32 y.o. female.  Patient is a 32 year old female with no significant past medical history presenting to the emergency department with fever and headache.  The patient states that Victoria Orozco has had fevers and headaches for the last 3 days.  Victoria Orozco states it is a frontal headache and it radiates across the top of the back of her head.  Victoria Orozco denies any vision changes, numbness or weakness, nausea or vomiting.  Victoria Orozco denies any neck pain or stiffness.  Victoria Orozco denies any cough or congestion or sore throat.  Victoria Orozco denies dysuria or hematuria but reports that her urine appears cloudy.  States that Victoria Orozco did have some diarrhea prior to her fever starting but the diarrhea has now resolved.  Victoria Orozco denies any known sick contacts.  The history is provided by the patient.  Fever Associated symptoms: headaches   Headache Associated symptoms: fever        Home Medications Prior to Admission medications   Medication Sig Start Date End Date Taking? Authorizing Provider  cephALEXin (KEFLEX) 500 MG capsule Take 1 capsule (500 mg total) by mouth 4 (four) times daily. 06/06/23  Yes Theresia Lo, Turkey K, DO  fluconazole (DIFLUCAN) 200 MG tablet Take 1 tablet (200 mg total) by mouth once for 1 dose. As needed for yeast vaginitis 06/06/23 06/06/23 Yes Theresia Lo, Turkey K, DO  Benzocaine-Resorcinol (VAGISIL) 5-2 % CREA Apply topically as needed for genital irritation. 09/29/22   Garlon Hatchet, PA-C  diphenhydrAMINE (BENADRYL) 25 MG tablet Take 1 tablet (25 mg total) by mouth at bedtime as needed for up to 15 doses for itching. 03/02/21   Trifan, Kermit Balo, MD  methocarbamol (ROBAXIN) 500 MG tablet Take 1 tablet (500 mg total) by mouth 2 (two) times daily as needed for muscle spasms. 07/17/21    Placido Sou, PA-C  naproxen (NAPROSYN) 375 MG tablet Take 1 tablet (375 mg total) by mouth 2 (two) times daily. 12/11/17   Aviva Kluver B, PA-C  nystatin cream (MYCOSTATIN) Apply to affected area 2 times daily 04/10/23   Achille Rich, PA-C  oseltamivir (TAMIFLU) 75 MG capsule Take 1 capsule (75 mg total) by mouth every 12 (twelve) hours. 09/07/18   Raelyn Mora, CNM  Prenatal Vit-Fe Fumarate-FA (MULTIVITAMIN-PRENATAL) 27-0.8 MG TABS tablet Take 1 tablet by mouth daily at 12 noon.    [provider]  sulfamethoxazole-trimethoprim (BACTRIM DS,SEPTRA DS) 800-160 MG tablet Take 1 tablet by mouth 2 (two) times daily. 09/07/18   Raelyn Mora, CNM      Allergies    Patient has no known allergies.    Review of Systems   Review of Systems  Constitutional:  Positive for fever.  Neurological:  Positive for headaches.    Physical Exam Updated Vital Signs BP 129/88   Pulse 96   Temp 98.9 F (37.2 C) (Oral)   Resp 19   Ht 5\' 4"  (1.626 m)   Wt 113 kg   LMP 05/04/2023 (Exact Date)   SpO2 100%   BMI 42.76 kg/m  Physical Exam Vitals and nursing note reviewed.  Constitutional:      General: Victoria Orozco is not in acute distress.    Appearance: Victoria Orozco is well-developed.  HENT:     Head: Normocephalic and atraumatic.  Mouth/Throat:     Mouth: Mucous membranes are moist.     Pharynx: Oropharynx is clear.  Eyes:     Extraocular Movements: Extraocular movements intact.     Pupils: Pupils are equal, round, and reactive to light.  Neck:     Meningeal: Brudzinski's sign and Kernig's sign absent.  Cardiovascular:     Rate and Rhythm: Normal rate and regular rhythm.     Heart sounds: Normal heart sounds.  Pulmonary:     Effort: Pulmonary effort is normal.     Breath sounds: Normal breath sounds.  Abdominal:     Palpations: Abdomen is soft.     Tenderness: There is no abdominal tenderness.  Musculoskeletal:        General: Normal range of motion.     Cervical back: Normal range of  motion and neck supple. No rigidity.  Skin:    General: Skin is warm and dry.  Neurological:     Mental Status: Victoria Orozco is alert and oriented to person, place, and time.     GCS: GCS eye subscore is 4. GCS verbal subscore is 5. GCS motor subscore is 6.     Cranial Nerves: No cranial nerve deficit, dysarthria or facial asymmetry.     Sensory: No sensory deficit.     Motor: No weakness.     Gait: Gait normal.  Psychiatric:        Mood and Affect: Mood normal.        Speech: Speech normal.        Behavior: Behavior normal.     ED Results / Procedures / Treatments   Labs (all labs ordered are listed, but only abnormal results are displayed) Labs Reviewed  URINALYSIS, ROUTINE W REFLEX MICROSCOPIC - Abnormal; Notable for the following components:      Result Value   APPearance HAZY (*)    Hgb urine dipstick SMALL (*)    Nitrite POSITIVE (*)    Leukocytes,Ua LARGE (*)    Bacteria, UA MANY (*)    All other components within normal limits  RESP PANEL BY RT-PCR (RSV, FLU A&B, COVID)  RVPGX2  PREGNANCY, URINE    EKG None  Radiology No results found.  Procedures Procedures    Medications Ordered in ED Medications  cephALEXin (KEFLEX) capsule 500 mg (has no administration in time range)  acetaminophen (TYLENOL) tablet 650 mg (650 mg Oral Given 06/06/23 1712)  ketorolac (TORADOL) 15 MG/ML injection 15 mg (15 mg Intravenous Given 06/06/23 1734)  metoCLOPramide (REGLAN) injection 10 mg (10 mg Intravenous Given 06/06/23 1734)    ED Course/ Medical Decision Making/ A&P Clinical Course as of 06/06/23 1938  Sun Jun 06, 2023  1841 UA positive for UTI. Will be started on keflex. Viral swab negative. Patient is stable for discharge home with outpatient follow up. [VK]    Clinical Course User Index [VK] Rexford Maus, DO                                 Medical Decision Making This patient presents to the ED with chief complaint(s) of fever, headache with no pertinent past  medical history which further complicates the presenting complaint. The complaint involves an extensive differential diagnosis and also carries with it a high risk of complications and morbidity.    The differential diagnosis includes patient has no meningismus on exam making a meningitis unlikely, suspect viral syndrome, no significant congestion making a  sinusitis unlikely, possible UTI with dysuria, no cough or shortness of breath making pneumonia unlikely  Additional history obtained: Additional history obtained from N/A Records reviewed N/A  ED Course and Reassessment: On patient's arrival Victoria Orozco was initially febrile and mildly tachycardic otherwise hemodynamically stable in no acute distress.  Did receive Tylenol in triage with improvement of her vital signs.  The patient has no meningeal signs on exam and no focal neurologic deficits making meningitis or other intracranial infection unlikely.  Suspect likely viral syndrome versus UTI with urinary symptoms and will have viral swab and UA ordered.  Will be given headache cocktail and will be closely reassessed.  Independent labs interpretation:  The following labs were independently interpreted: UA positive for UTI, viral swab negative  Independent visualization of imaging: - N/A  Consultation: - Consulted or discussed management/test interpretation w/ external professional: N/A  Consideration for admission or further workup: Patient has no emergent conditions requiring admission or further work-up at this time and is stable for discharge home with primary care follow-up  Social Determinants of health: N/A    Amount and/or Complexity of Data Reviewed Labs: ordered.  Risk OTC drugs. Prescription drug management.          Final Clinical Impression(s) / ED Diagnoses Final diagnoses:  Acute cystitis without hematuria    Rx / DC Orders ED Discharge Orders          Ordered    cephALEXin (KEFLEX) 500 MG capsule  4 times  daily        06/06/23 1935    fluconazole (DIFLUCAN) 200 MG tablet   Once        06/06/23 1935              Rexford Maus, DO 06/06/23 1938

## 2023-06-21 IMAGING — DX DG THORACIC SPINE 2V
3 series · 3 of 3 positions shown · non-contrast
Comparison: No priors.

CLINICAL DATA: 29-year-old female with history of back pain.

EXAM:
THORACIC SPINE 2 VIEWS

[t-spine ap]
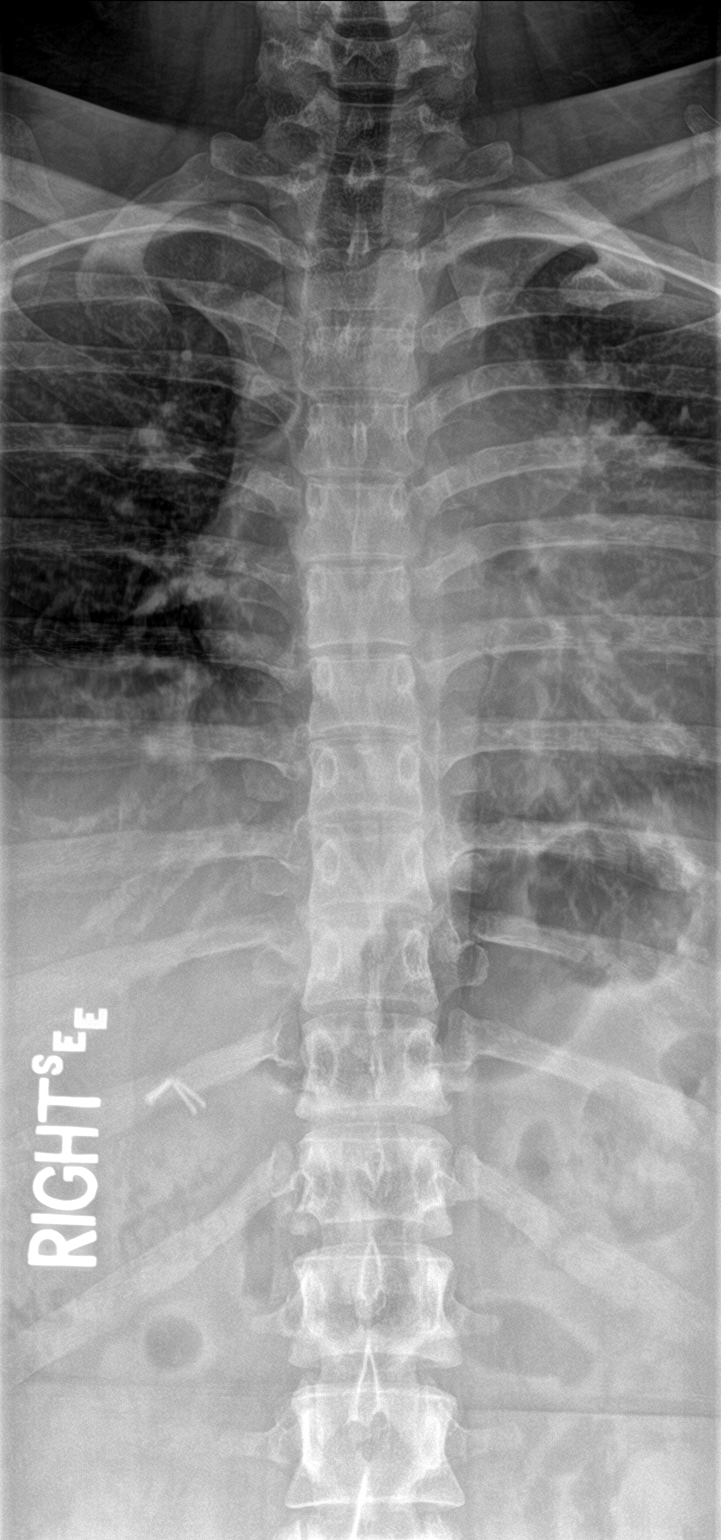

[t-spine lat]
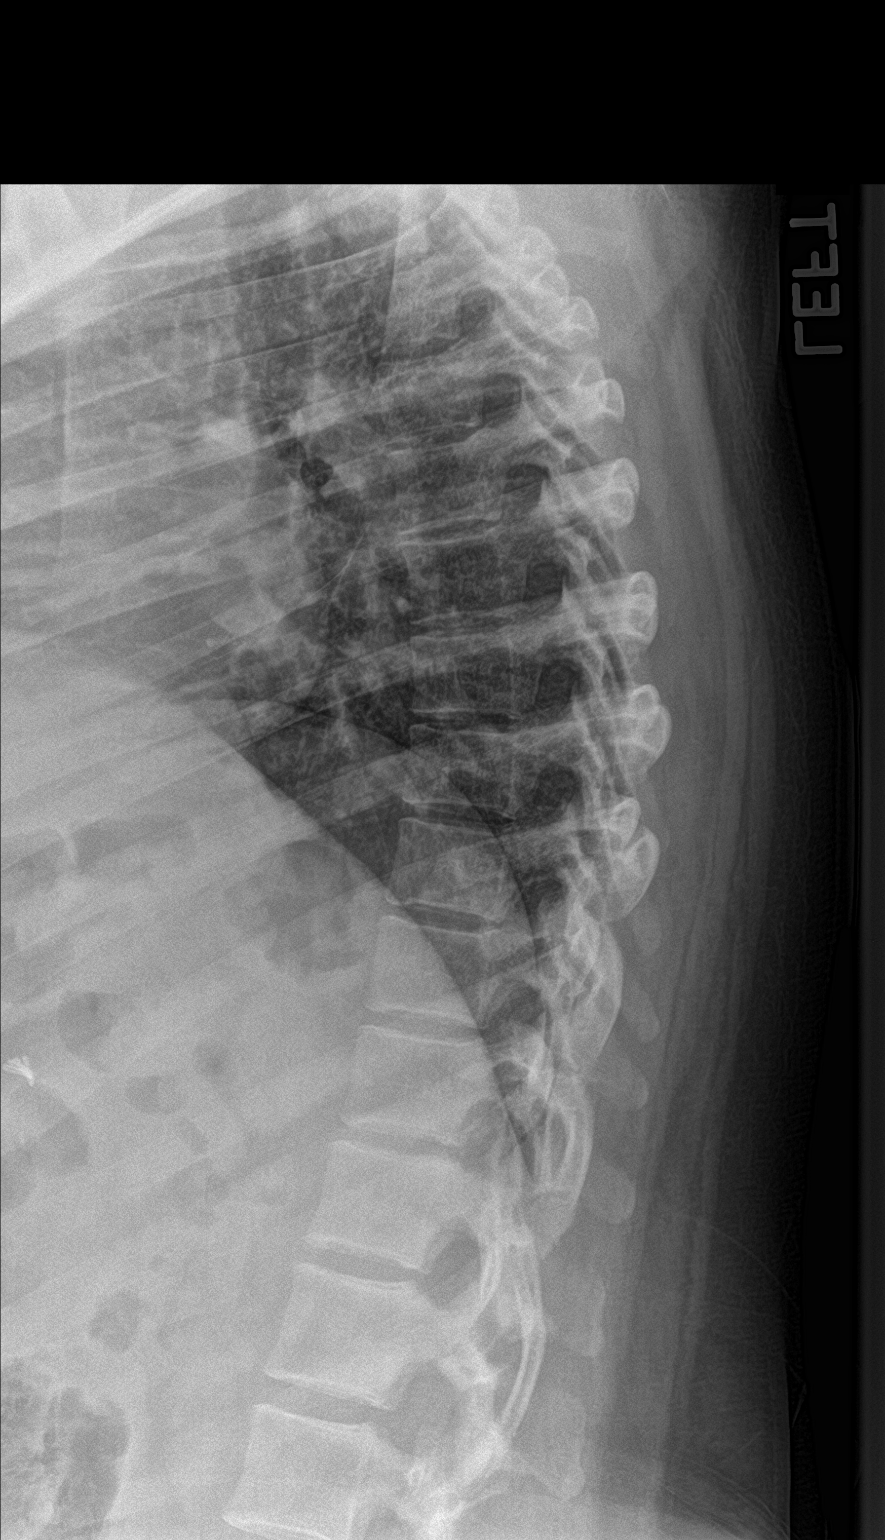

[t-spine swimmers]
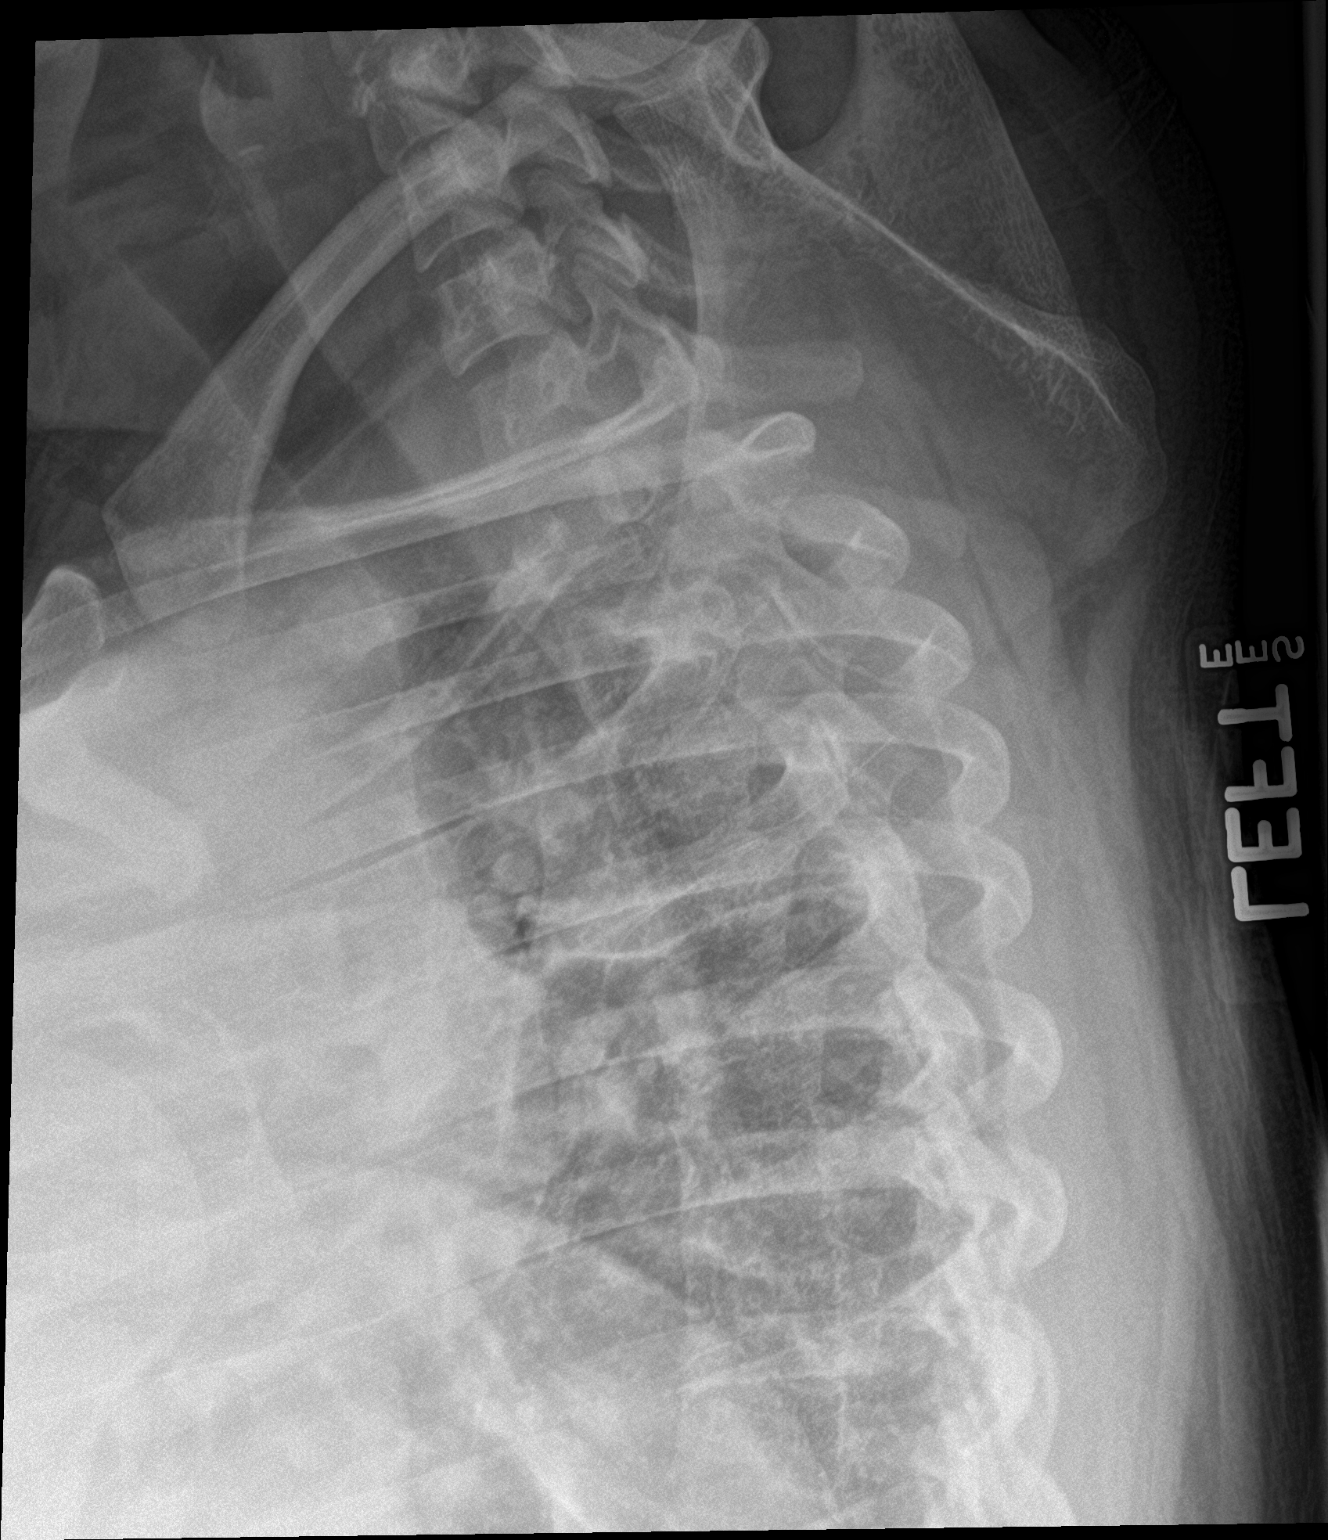

[3 of 3 positions shown; findings below may reference images not displayed]

FINDINGS: There is no evidence of thoracic spine fracture. Alignment is
normal. No other significant bone abnormalities are identified.
Surgical clips project over the right upper quadrant of the abdomen,
likely from prior cholecystectomy.
IMPRESSION: Negative.

## 2024-06-15 ENCOUNTER — Encounter (HOSPITAL_COMMUNITY): Payer: Self-pay | Admitting: *Deleted

## 2024-06-15 ENCOUNTER — Emergency Department (HOSPITAL_COMMUNITY)
Admission: EM | Admit: 2024-06-15 | Discharge: 2024-06-15 | Disposition: A | Discharge status: Home / Self Care | Payer: Self-pay | Attending: Emergency Medicine | Admitting: Emergency Medicine

## 2024-06-15 ENCOUNTER — Other Ambulatory Visit: Payer: Self-pay

## 2024-06-15 DIAGNOSIS — B001 Herpesviral vesicular dermatitis: Secondary | ICD-10-CM | POA: Insufficient documentation

## 2024-06-15 NOTE — Discharge Instructions (Signed)
 Use over-the-counter Abreva cream on the area.  Avoid picking or rubbing the area.  Do not share drinks as this area is contagious.  Switch your toothbrush once the area is gone.  Follow-up with PCP for recurrent similar areas as needed.  Return to ED sooner if any symptoms worsen including severe pain/swelling to the lip or difficulty breathing.

## 2024-06-15 NOTE — ED Provider Notes (Signed)
 [place] EMERGENCY DE[place]MENT AT [facility] Provider Note   CSN: [id number] Arrival date & time: 06/15/24  8151     Patient presents with: Abscess   Victoria Orozco is a 33 y.o. female.  Patient is a 33 year old female with no significant medical history who presents to the ED for a fall to the lower lip that first appeared earlier today.  She notes the area has been itching but is not painful or draining.  She states her husband does get cold sores but she has never had 1 personally.  She has not tried to put any creams or ointments on the area.  She notes she did have a runny nose recently and has been feeling a little rundown but no recent illnesses.  Denies fevers, chills, headache, chest pain, shortness of breath.  No further complaints.  Abscess Associated symptoms: no fever and no headaches        Prior to Admission medications   Medication Sig Start Date End Date Taking? Authorizing Provider  Benzocaine -Resorcinol (VAGISIL) 5-2 % CREA Apply topically as needed for genital irritation. 09/29/22   Jarold Olam HERO, PA-C  cephALEXin  (KEFLEX ) 500 MG capsule Take 1 capsule (500 mg total) by mouth 4 (four) times daily. 06/06/23   Kingsley, Victoria K, DO  diphenhydrAMINE  (BENADRYL ) 25 MG tablet Take 1 tablet (25 mg total) by mouth at bedtime as needed for up to 15 doses for itching. 03/02/21   Cottie Donnice PARAS, MD  methocarbamol  (ROBAXIN ) 500 MG tablet Take 1 tablet (500 mg total) by mouth 2 (two) times daily as needed for muscle spasms. 07/17/21   Joldersma, Logan, PA-C  naproxen  (NAPROSYN ) 375 MG tablet Take 1 tablet (375 mg total) by mouth 2 (two) times daily. 12/11/17   Murray, Alyssa B, PA-C  nystatin  cream (MYCOSTATIN ) Apply to affected area 2 times daily 04/10/23   Bernis Ernst, PA-C  oseltamivir  (TAMIFLU ) 75 MG capsule Take 1 capsule (75 mg total) by mouth every 12 (twelve) hours. 09/07/18   Letha Renshaw, CNM  Prenatal Vit-Fe Fumarate-FA  (MULTIVITAMIN-PRENATAL) 27-0.8 MG TABS tablet Take 1 tablet by mouth daily at 12 noon.    [provider]  sulfamethoxazole -trimethoprim  (BACTRIM  DS,SEPTRA  DS) 800-160 MG tablet Take 1 tablet by mouth 2 (two) times daily. 09/07/18   Dawson, Rolitta, CNM    Allergies: Patient has no known allergies.    Review of Systems  Constitutional:  Negative for chills and fever.  Respiratory:  Negative for shortness of breath.   Cardiovascular:  Negative for chest pain.  Skin:  Positive for rash. Negative for wound.  Neurological:  Negative for dizziness and headaches.  All other systems reviewed and are negative.   Updated Vital Signs BP (!) 151/97   Pulse 65   Temp 97.6 F (36.4 C)   Resp 18   LMP 05/22/2024   SpO2 100%   Physical Exam Constitutional:      Appearance: Normal appearance.  HENT:     Head: Normocephalic and atraumatic.     Nose: Nose normal.     Mouth/Throat:     Mouth: Mucous membranes are moist.     Pharynx: Oropharynx is clear.     Comments: Small erythematous blister present on the bottom left lower lid with small central area of purulence.  Area is not tender to palpation and there is no surrounding erythema/edema.  Area consistent with a cold sore. Cardiovascular:     Rate and Rhythm: Normal rate.  Pulmonary:  Effort: Pulmonary effort is normal.  Skin:    General: Skin is warm and dry.  Neurological:     Mental Status: She is alert and oriented to person, place, and time.  Psychiatric:        Mood and Affect: Mood normal.        Behavior: Behavior normal.     (all labs ordered are listed, but only abnormal results are displayed) Labs Reviewed - No data to display  EKG: None  Radiology: No results found.    Medications Ordered in the ED - No data to display                                 Medical Decision Making Patient is a 33 year old female with no significant medical history presents to the ED for a lesion to the left lower lip  that first appeared earlier today.  Please see detailed HPI above.  On exam patient is alert and in no acute distress.  Physical exam as noted above.  Area is consistent with a cold sore.  There is no surrounding erythema or fluctuance to suggest cellulitis or abscess.  This is patient's first cold sore.  No signs of angioedema or respiratory distress.  Otherwise vital signs stable and well-appearing.  Stable for discharge home.  Advised over-the-counter cream.  Symptomatic care and social instructions provided.  Advised PCP follow-up for any continued concerns.  Return precautions provided for worsening symptoms.       Final diagnoses:  Cold sore    ED Discharge Orders     None          Victoria Orozco 06/15/24 2052    Victoria Charleston, MD 06/16/24 2052

## 2024-06-15 NOTE — ED Triage Notes (Signed)
 Pt noticed small bump to left lower lip onset today, denies pain.
